# Patient Record
Sex: Female | Born: 1955 | Race: White | Hispanic: No | Marital: Married | Smoking: Former smoker
Health system: Southern US, Community
[De-identification: ages and names within clinical notes are randomized; demographics above are authoritative.]

## PROBLEM LIST (undated history)

## (undated) DIAGNOSIS — Z8719 Personal history of other diseases of the digestive system: Secondary | ICD-10-CM

## (undated) DIAGNOSIS — N63 Unspecified lump in unspecified breast: Secondary | ICD-10-CM

## (undated) DIAGNOSIS — Z803 Family history of malignant neoplasm of breast: Secondary | ICD-10-CM

## (undated) DIAGNOSIS — N952 Postmenopausal atrophic vaginitis: Secondary | ICD-10-CM

## (undated) DIAGNOSIS — B009 Herpesviral infection, unspecified: Secondary | ICD-10-CM

## (undated) DIAGNOSIS — Z9889 Other specified postprocedural states: Secondary | ICD-10-CM

## (undated) DIAGNOSIS — M8580 Other specified disorders of bone density and structure, unspecified site: Secondary | ICD-10-CM

## (undated) DIAGNOSIS — S52123A Displaced fracture of head of unspecified radius, initial encounter for closed fracture: Secondary | ICD-10-CM

## (undated) HISTORY — DX: Postmenopausal atrophic vaginitis: N95.2

## (undated) HISTORY — DX: Other specified postprocedural states: Z98.890

## (undated) HISTORY — DX: Unspecified lump in unspecified breast: N63.0

## (undated) HISTORY — DX: Family history of malignant neoplasm of breast: Z80.3

## (undated) HISTORY — DX: Herpesviral infection, unspecified: B00.9

## (undated) HISTORY — DX: Other specified disorders of bone density and structure, unspecified site: M85.80

## (undated) HISTORY — DX: Displaced fracture of head of unspecified radius, initial encounter for closed fracture: S52.123A

## (undated) HISTORY — PX: BREAST EXCISIONAL BIOPSY: SUR124

## (undated) HISTORY — DX: Personal history of other diseases of the digestive system: Z87.19

---

## 1997-04-05 ENCOUNTER — Other Ambulatory Visit: Admission: RE | Admit: 1997-04-05 | Discharge: 1997-04-05 | Payer: Self-pay | Admitting: Obstetrics and Gynecology

## 1997-07-09 ENCOUNTER — Other Ambulatory Visit: Admission: RE | Admit: 1997-07-09 | Discharge: 1997-07-09 | Payer: Self-pay | Admitting: Obstetrics and Gynecology

## 1997-12-25 ENCOUNTER — Other Ambulatory Visit: Admission: RE | Admit: 1997-12-25 | Discharge: 1997-12-25 | Payer: Self-pay | Admitting: Obstetrics and Gynecology

## 1998-04-03 ENCOUNTER — Other Ambulatory Visit: Admission: RE | Admit: 1998-04-03 | Discharge: 1998-04-03 | Payer: Self-pay | Admitting: Obstetrics and Gynecology

## 1999-05-05 ENCOUNTER — Other Ambulatory Visit: Admission: RE | Admit: 1999-05-05 | Discharge: 1999-05-05 | Payer: Self-pay | Admitting: Obstetrics and Gynecology

## 1999-05-06 ENCOUNTER — Encounter (INDEPENDENT_AMBULATORY_CARE_PROVIDER_SITE_OTHER): Payer: Self-pay | Admitting: Specialist

## 1999-05-06 ENCOUNTER — Other Ambulatory Visit: Admission: RE | Admit: 1999-05-06 | Discharge: 1999-05-06 | Payer: Self-pay | Admitting: Obstetrics and Gynecology

## 2000-07-08 ENCOUNTER — Other Ambulatory Visit: Admission: RE | Admit: 2000-07-08 | Discharge: 2000-07-08 | Payer: Self-pay | Admitting: Obstetrics and Gynecology

## 2000-10-16 ENCOUNTER — Emergency Department (HOSPITAL_COMMUNITY): Admission: EM | Admit: 2000-10-16 | Discharge: 2000-10-16 | Payer: Self-pay | Admitting: Emergency Medicine

## 2000-10-28 ENCOUNTER — Encounter: Admission: RE | Admit: 2000-10-28 | Discharge: 2000-10-28 | Payer: Self-pay | Admitting: Gastroenterology

## 2000-10-28 ENCOUNTER — Encounter: Payer: Self-pay | Admitting: Gastroenterology

## 2000-11-15 ENCOUNTER — Ambulatory Visit (HOSPITAL_COMMUNITY): Admission: RE | Admit: 2000-11-15 | Discharge: 2000-11-15 | Payer: Self-pay | Admitting: Gastroenterology

## 2001-07-12 ENCOUNTER — Other Ambulatory Visit: Admission: RE | Admit: 2001-07-12 | Discharge: 2001-07-12 | Payer: Self-pay | Admitting: Obstetrics and Gynecology

## 2001-07-21 ENCOUNTER — Encounter: Admission: RE | Admit: 2001-07-21 | Discharge: 2001-07-21 | Payer: Self-pay | Admitting: Obstetrics and Gynecology

## 2001-07-21 ENCOUNTER — Encounter: Payer: Self-pay | Admitting: Obstetrics and Gynecology

## 2001-11-08 ENCOUNTER — Encounter: Payer: Self-pay | Admitting: Obstetrics and Gynecology

## 2001-11-08 ENCOUNTER — Encounter: Admission: RE | Admit: 2001-11-08 | Discharge: 2001-11-08 | Payer: Self-pay | Admitting: Obstetrics and Gynecology

## 2002-02-21 ENCOUNTER — Encounter: Admission: RE | Admit: 2002-02-21 | Discharge: 2002-02-21 | Payer: Self-pay | Admitting: Obstetrics and Gynecology

## 2002-02-21 ENCOUNTER — Encounter: Payer: Self-pay | Admitting: Obstetrics and Gynecology

## 2002-07-13 ENCOUNTER — Other Ambulatory Visit: Admission: RE | Admit: 2002-07-13 | Discharge: 2002-07-13 | Payer: Self-pay | Admitting: Obstetrics and Gynecology

## 2003-08-23 ENCOUNTER — Other Ambulatory Visit: Admission: RE | Admit: 2003-08-23 | Discharge: 2003-08-23 | Payer: Self-pay | Admitting: Obstetrics and Gynecology

## 2003-11-09 ENCOUNTER — Encounter (INDEPENDENT_AMBULATORY_CARE_PROVIDER_SITE_OTHER): Payer: Self-pay | Admitting: *Deleted

## 2003-11-09 ENCOUNTER — Ambulatory Visit (HOSPITAL_COMMUNITY): Admission: RE | Admit: 2003-11-09 | Discharge: 2003-11-09 | Payer: Self-pay | Admitting: *Deleted

## 2003-11-09 ENCOUNTER — Ambulatory Visit (HOSPITAL_BASED_OUTPATIENT_CLINIC_OR_DEPARTMENT_OTHER): Admission: RE | Admit: 2003-11-09 | Discharge: 2003-11-09 | Payer: Self-pay | Admitting: *Deleted

## 2004-09-02 ENCOUNTER — Other Ambulatory Visit: Admission: RE | Admit: 2004-09-02 | Discharge: 2004-09-02 | Payer: Self-pay | Admitting: Obstetrics and Gynecology

## 2004-09-10 ENCOUNTER — Encounter: Admission: RE | Admit: 2004-09-10 | Discharge: 2004-09-10 | Payer: Self-pay | Admitting: Obstetrics and Gynecology

## 2004-09-23 ENCOUNTER — Encounter: Admission: RE | Admit: 2004-09-23 | Discharge: 2004-09-23 | Payer: Self-pay | Admitting: Obstetrics and Gynecology

## 2005-07-13 ENCOUNTER — Ambulatory Visit: Payer: Self-pay | Admitting: Infectious Diseases

## 2005-09-02 ENCOUNTER — Encounter: Admission: RE | Admit: 2005-09-02 | Discharge: 2005-09-02 | Payer: Self-pay | Admitting: Obstetrics and Gynecology

## 2005-09-09 ENCOUNTER — Other Ambulatory Visit: Admission: RE | Admit: 2005-09-09 | Discharge: 2005-09-09 | Payer: Self-pay | Admitting: Obstetrics and Gynecology

## 2006-09-08 ENCOUNTER — Encounter: Admission: RE | Admit: 2006-09-08 | Discharge: 2006-09-08 | Payer: Self-pay | Admitting: Obstetrics and Gynecology

## 2006-10-04 ENCOUNTER — Other Ambulatory Visit: Admission: RE | Admit: 2006-10-04 | Discharge: 2006-10-04 | Payer: Self-pay | Admitting: Obstetrics and Gynecology

## 2007-09-13 ENCOUNTER — Encounter: Admission: RE | Admit: 2007-09-13 | Discharge: 2007-09-13 | Payer: Self-pay | Admitting: Obstetrics and Gynecology

## 2007-10-05 ENCOUNTER — Other Ambulatory Visit: Admission: RE | Admit: 2007-10-05 | Discharge: 2007-10-05 | Payer: Self-pay | Admitting: Obstetrics and Gynecology

## 2007-11-23 ENCOUNTER — Encounter: Admission: RE | Admit: 2007-11-23 | Discharge: 2007-11-23 | Payer: Self-pay | Admitting: Neurosurgery

## 2007-12-07 ENCOUNTER — Ambulatory Visit (HOSPITAL_COMMUNITY): Admission: RE | Admit: 2007-12-07 | Discharge: 2007-12-07 | Payer: Self-pay | Admitting: Neurosurgery

## 2008-06-06 ENCOUNTER — Encounter: Payer: Self-pay | Admitting: Infectious Diseases

## 2008-06-13 ENCOUNTER — Encounter: Admission: RE | Admit: 2008-06-13 | Discharge: 2008-06-13 | Payer: Self-pay | Admitting: Neurosurgery

## 2008-06-15 ENCOUNTER — Encounter: Admission: RE | Admit: 2008-06-15 | Discharge: 2008-06-15 | Payer: Self-pay | Admitting: Neurosurgery

## 2008-10-01 ENCOUNTER — Encounter: Admission: RE | Admit: 2008-10-01 | Discharge: 2008-10-01 | Payer: Self-pay | Admitting: Obstetrics and Gynecology

## 2008-11-06 ENCOUNTER — Other Ambulatory Visit: Admission: RE | Admit: 2008-11-06 | Discharge: 2008-11-06 | Payer: Self-pay | Admitting: Obstetrics and Gynecology

## 2009-05-17 ENCOUNTER — Ambulatory Visit (HOSPITAL_COMMUNITY): Admission: RE | Admit: 2009-05-17 | Discharge: 2009-05-17 | Payer: Self-pay | Admitting: Obstetrics and Gynecology

## 2009-10-07 ENCOUNTER — Encounter: Admission: RE | Admit: 2009-10-07 | Discharge: 2009-10-07 | Payer: Self-pay | Admitting: Family Medicine

## 2009-11-13 ENCOUNTER — Other Ambulatory Visit: Admission: RE | Admit: 2009-11-13 | Discharge: 2009-11-13 | Payer: Self-pay | Admitting: Obstetrics and Gynecology

## 2010-01-31 ENCOUNTER — Other Ambulatory Visit: Payer: Self-pay | Admitting: Dermatology

## 2010-05-12 DIAGNOSIS — M8580 Other specified disorders of bone density and structure, unspecified site: Secondary | ICD-10-CM

## 2010-05-12 DIAGNOSIS — S52123A Displaced fracture of head of unspecified radius, initial encounter for closed fracture: Secondary | ICD-10-CM | POA: Insufficient documentation

## 2010-05-12 DIAGNOSIS — B009 Herpesviral infection, unspecified: Secondary | ICD-10-CM

## 2010-05-12 DIAGNOSIS — N952 Postmenopausal atrophic vaginitis: Secondary | ICD-10-CM

## 2010-05-12 DIAGNOSIS — K5732 Diverticulitis of large intestine without perforation or abscess without bleeding: Secondary | ICD-10-CM | POA: Insufficient documentation

## 2010-05-12 DIAGNOSIS — G4700 Insomnia, unspecified: Secondary | ICD-10-CM

## 2010-05-12 DIAGNOSIS — N901 Moderate vulvar dysplasia: Secondary | ICD-10-CM | POA: Insufficient documentation

## 2010-05-12 DIAGNOSIS — L299 Pruritus, unspecified: Secondary | ICD-10-CM

## 2010-05-12 DIAGNOSIS — M47816 Spondylosis without myelopathy or radiculopathy, lumbar region: Secondary | ICD-10-CM | POA: Insufficient documentation

## 2010-05-14 ENCOUNTER — Ambulatory Visit (INDEPENDENT_AMBULATORY_CARE_PROVIDER_SITE_OTHER): Payer: 59 | Admitting: Infectious Disease

## 2010-05-14 ENCOUNTER — Encounter: Payer: Self-pay | Admitting: Infectious Disease

## 2010-05-14 DIAGNOSIS — R509 Fever, unspecified: Secondary | ICD-10-CM

## 2010-05-14 DIAGNOSIS — A770 Spotted fever due to Rickettsia rickettsii: Secondary | ICD-10-CM

## 2010-05-14 DIAGNOSIS — M1990 Unspecified osteoarthritis, unspecified site: Secondary | ICD-10-CM | POA: Insufficient documentation

## 2010-05-14 DIAGNOSIS — B949 Sequelae of unspecified infectious and parasitic disease: Secondary | ICD-10-CM

## 2010-05-14 DIAGNOSIS — B948 Sequelae of other specified infectious and parasitic diseases: Secondary | ICD-10-CM

## 2010-05-14 DIAGNOSIS — M129 Arthropathy, unspecified: Secondary | ICD-10-CM

## 2010-05-14 DIAGNOSIS — A779 Spotted fever, unspecified: Secondary | ICD-10-CM

## 2010-05-14 NOTE — Assessment & Plan Note (Signed)
Lady's serologies indicate she has had right amounts but her fever in the past. Not clear to me that she had this in April and I was thought to have gotten much more gravely ill without antibiotics for 15 days. Will check right amounts but fever antibodies in her look antibodies to help the patient have clarity on what the cause of her recent acute illness was actually no role for further antibiotic therapy.

## 2010-05-14 NOTE — Assessment & Plan Note (Signed)
Given to this patient had a migrating rash and arthritic symptoms with fever in 2006 with apparently reportedly positive antibodies to Borrelia it is possible that she had brilliant. The antibodies that we have now however are negative IgG which is not consistent with her having Borrelia back in 2006. She does have positive IgM test for Borrelia I suspect this is a false-positive 3. Certainly her recent illness that she had in April does not sound at all like Lyme infection. Of note the Western blot is for Borrelia is fraught with parallel with only a requirement of 2 of 3 bands being positive. More than half the population has a positive P. 41 and body and many other individuals have a second positive antibody on this panel. In any case whether or not her chronic arthritis is due to a post Lyme arthritis or a zoster arthritis the management of the same namely nonsteroidals. There is actually no role for chronic antibiotics. I warned her of certain practitioners you like to make money off of patient's by giving them medications that are not indicated for a condition of "chronic Lyme infection". This is not a recognized clinical condition by infectious disease Society of [place]

## 2010-05-14 NOTE — Progress Notes (Signed)
Subjective:    Patient ID: Joy Arroyo, female    DOB: 13-Jan-1956, 55 y.o.   MRN: [id number]  HPI Joy Arroyo is a 55 year old Caucasian female who was referred to me by Dr. Sigmund Hazel, who had a prior illness in 2006 in which she had been bitten by a tick. At that time she developed an erythematous rash with this central clearing that could have been consistent with erythema migrans. She was treated within a day or 2 by her primary care physician at the time with a antibiotics for possible Borrelia burgdorferi, ultimately receiving a month of antibiotics. She apparently had antibodies that were positive for Lyme at that time although I do not have the serologies from that time in front of me. Any case she continued to have arthritic complaints that began after her tick bite and continued for months to years afterwards. She did not have other chronic symptoms other than fatigue which has been an issue for some time. She was recently again bitten by a tick parts 3 takes she then awoke on April 5 with vertiginous symptoms nausea chest tightness achiness with 2 bumps on her back she had some dizziness and nausea at work tells me she does me she had a fever although has not recorded in her journal that she presented to me she slept more than usual she felt exhausted. And had a sharp pain in her binder on and had a headache. Ultimately she saw her physician Dr. Hyacinth Meeker and had antibody testing done for [facility] spotted fever as well as for Lyme. Of note the patient was not treated with the doxycycline for [facility] spotted fever which showed a distended the conical suspicion was high that she would receive this antibiotic. In any case her [facility] spotted for fever IgG was positive on testing on the 20th and then she then received a seven-day course of doxycycline. GIM at that time was negative. Her antibody testing for Lyme came back with a negative Western blot for Borrelia by IgG with only the P4E1  antibody being positive and (5 of 8 bands AB positive for IgG to be positive.. Her IgM was positive with a IgM P4E1 antibody and IgM P. 39 antibody. She was referred to our clinic for further investigation of possible tick borne illnesses. I reviewed all of her laboratory data and her symptoms. I think it is possible that she had infection with Lyme in 2006 given that she had erythema migrans and arthritic condition. I would've expected however for her IgG to be positive on Western blot testing if this was truly an infection with Borrelia. I suppose it is possible that the antibiotics that she was given for the blood attorney in response but then this makes the question of why was the IgM positive. We frequently encounter false positive Lyme antibody tests especially Lyme IgM antibody tests. I suspect that this may that be the case with her that she may never have had Lyme he could have instead had sudden tick associated rash illness. In any case whether or not she had Lyme or not there is no indication for chronic antibiotic therapy for the chronic arthritis that sometimes happens after Lyme infection. I explained to her that there is no reason to give antibiotics for Borrelia and I was skeptical even of her original diagnosis. With regards to [facility] spotted fever the IgG that was positive shows that she densely had [facility] spotted fever at some point in  her life I wouldn't surprise of the IgG titer will be positive so quickly after the onset of her illness. It is possible. I would however expect the IgM also just been positive. In any case whether or not she had [facility] spotted fever she clearly recovered and she seems to have recovered without antibiotics because she was had the initial onset on the fifth of the month and was only treated more than 15 days later. End of whether or not she had [facility] spotted fever or not she clearly better there is no role for further antibiotic therapy. To help  clarify the specific episode O. repeat [facility] spotted fever IgG and IgM so that we can compare the titers. Also check her lithium antibodies. There is absolutely no role for further antibiotic therapy. I also explained that in the future should she have an acute illness at which [facility] spotted fever or [facility] being entertained that she should be given doxycycline immediately rather than waiting for tests to come back. With regards to her chronic arthritic complaints they could be due to osteoarthritis they could be due to possibly due to post Lyme arthritis although again on skeptical of this. In any case I counseled her to use nonsteroidals. I recommended checking some basic labs. It all out metastatic more than an hour with this patient including greater than 50% of time in counseling the patient according her care. the Review of Systems as in history present illness otherwise 12 point review of systems is completely negative.     Objective:   Physical Exam The patient is slightly underweight but healthy appearing she is alert and oriented x4. HEENT normocephalic and somatic pupils equal round react to light sclerae anicteric oropharynx clear without lesions or exudate. Neck supple cardiac exam regular rate and rhythm lungs clear to auscultation without wheezes or rhonchi. Abdomen soft nondistended nontender. Extremities without edema. Muscle skeletal exam she is wearing a carpal tunnel brace on her right wrist. She has some tenderness in her PIP and DIP joints but not much significant synovitis present. She has no elbow effusions her knees are without effusions and without tenderness to varus or valgus stress. Her neurological exam is nonfocal with normal gait normal strength and normal sensation grossly. Skin exam is negative for petechiae bruises rashes or other lesions. Gastric exam patient is slightly anxious but otherwise appropriate and pleasant and engaged with normal linear thought  processes normal affect otherwise.       Assessment & Plan:  [facility] spotted fever) Lady's serologies indicate she has had right amounts but her fever in the past. Not clear to me that she had this in April and I was thought to have gotten much more gravely ill without antibiotics for 15 days. Will check right amounts but fever antibodies in her look antibodies to help the patient have clarity on what the cause of her recent acute illness was actually no role for further antibiotic therapy.  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 The antibodies that we have now however are negative IgG which is not consistent with her having Borrelia back in 2006. She does have positive IgM test for Borrelia I suspect this is a false-positive 3. Certainly her recent illness that she had in April does not sound at all like Lyme infection. Of  note the Western blot is for Borrelia is fraught with parallel with only a requirement of 2 of 3 bands being positive. More than half the population has a positive P. 41 and body and many other individuals have a second positive antibody on this panel. In any case whether or not her chronic arthritis is due to a post Lyme arthritis or a zoster arthritis the management of the same namely nonsteroidals. There is actually no role for chronic antibiotics. I warned her of certain practitioners you like to make money off of patient's by giving them medications that are not indicated for a condition of "chronic Lyme infection". This is not a recognized clinical condition by infectious disease Society of [place]   Arthritis I suspect she has osteoarthritis but I will check an ANA sedimentation rate and C-reactive protein and rheumatoid factor. If she needs to she could see a rheumatologist for further workup  she had plain films of her hands done by hand surgeon years ago.

## 2010-05-14 NOTE — Assessment & Plan Note (Signed)
I suspect she has osteoarthritis but I will check an ANA sedimentation rate and C-reactive protein and rheumatoid factor. If she needs to she could see a rheumatologist for further workup she had plain films of her hands done by hand surgeon years ago.

## 2010-05-29 ENCOUNTER — Telehealth: Payer: Self-pay | Admitting: *Deleted

## 2010-05-29 NOTE — Telephone Encounter (Signed)
Pt left message wanting to talk with Dr. Daiva Eves about any further treatment or lab work.  She stated that she knew her lab results.  RN returned her call.  Pt is not experiencing fever, H/A, or joint pain.   Her fatigue is improving.  She only wanted to know whether Dr. Daiva Eves would recommend repeating the RSMF test as was mentioned in the lab report.  Pt was advised that Dr. Daiva Eves is currently on vacation and would not be able to respond quickly.  Pt verbalized understanding.  Jennet Maduro, RN

## 2010-05-30 ENCOUNTER — Encounter: Payer: Self-pay | Admitting: Infectious Disease

## 2010-05-30 NOTE — Op Note (Signed)
Joy Arroyo, Joy Arroyo                ACCOUNT NO.:  [id number]   MEDICAL RECORD NO.:  [id number]          PATIENT TYPE:  AMB   LOCATION:  [facility]                         FACILITY:  [facility]   PHYSICIAN:  Vikki Ports, MDDATE OF BIRTH:  06-30-1955   DATE OF PROCEDURE:  11/09/2003  DATE OF DISCHARGE:                                 OPERATIVE REPORT   PREOPERATIVE DIAGNOSIS:  Left inguinal hernia and 6 cm lipoma of the back.   POSTOPERATIVE DIAGNOSIS:  Left inguinal hernia and 6 cm lipoma of the back.   PROCEDURE:  Laparoscopic left inguinal hernia repair with mesh and excision  of lipoma of the back.   ANESTHESIA:  General.   SURGEON:  Vikki Ports, MD   DESCRIPTION OF PROCEDURE:  Patient was taken to the operating room and  placed in a supine position.  After adequate general anesthesia was induced,  the patient was placed in the right lateral decubitus position.  The back  was prepped and draped in a normal sterile fashion, and a small transverse  incision was made over the palpable mass.  A well-encapsulated lipoma was  excised from deep to the muscle and sent for pathologic evaluation.  Adequate hemostasis was insured, and the skin was closed with subcuticular 4-  0 Monocryl.   Patient was then placed in the supine position, and the abdomen was prepped  and draped in a normal sterile fashion after a Foley catheter had been  placed.  Using a transverse infraumbilical incision, I dissected down onto  the left anterior rectus fascia, which was opened.  Also, fibers were  retracted laterally, and pre-peritoneal dissecting balloon was placed, and  under direct visualization was dissected with pumps of air.  Then the  operating balloon was placed, and under direct visualization, two 5 mm  trocars were placed in the right abdomen.  A pseudohernia sac was delivered  posteriorly, and a piece of Parietex mesh was placed over the floor of  Hesselbach's triangle and tacked with  a spiral tacker.  I was satisfied with  the coverage of the mesh.  Pneumo pre-peritoneum was released.  Trocars were  removed.  The fascial defect was closed with a figure-of-eight 0 Vicryl  suture.  The skin was closed with subcuticular 4-0 Monocryl.  Steri-Strips  and sterile dressings were applied.  Patient tolerated the procedure well  and went to the PACU in good condition.     Gaylyn Rong   KRH/MEDQ  D:  11/16/2003  T:  11/16/2003  Job:  [id number]

## 2010-10-08 ENCOUNTER — Other Ambulatory Visit: Payer: Self-pay | Admitting: Obstetrics and Gynecology

## 2010-10-08 DIAGNOSIS — Z1231 Encounter for screening mammogram for malignant neoplasm of breast: Secondary | ICD-10-CM

## 2010-10-16 ENCOUNTER — Other Ambulatory Visit: Payer: Self-pay | Admitting: Dermatology

## 2010-10-21 ENCOUNTER — Ambulatory Visit
Admission: RE | Admit: 2010-10-21 | Discharge: 2010-10-21 | Disposition: A | Discharge status: Home / Self Care | Payer: 59 | Source: Ambulatory Visit | Attending: Obstetrics and Gynecology | Admitting: Obstetrics and Gynecology

## 2010-10-21 DIAGNOSIS — Z1231 Encounter for screening mammogram for malignant neoplasm of breast: Secondary | ICD-10-CM

## 2011-07-27 IMAGING — MG MM SCREEN MAMMOGRAM BILATERAL
3 series · 3 of 3 positions shown · non-contrast
Comparison: none

DG SCREEN MAMMOGRAM BILATERAL
Bilateral CC and MLO view(s) were taken.

DIGITAL SCREENING MAMMOGRAM WITH CAD:
The breast tissue is extremely dense.  No masses or malignant type calcifications are identified.  
Compared with prior studies.
Images were processed with CAD.

[R CC]
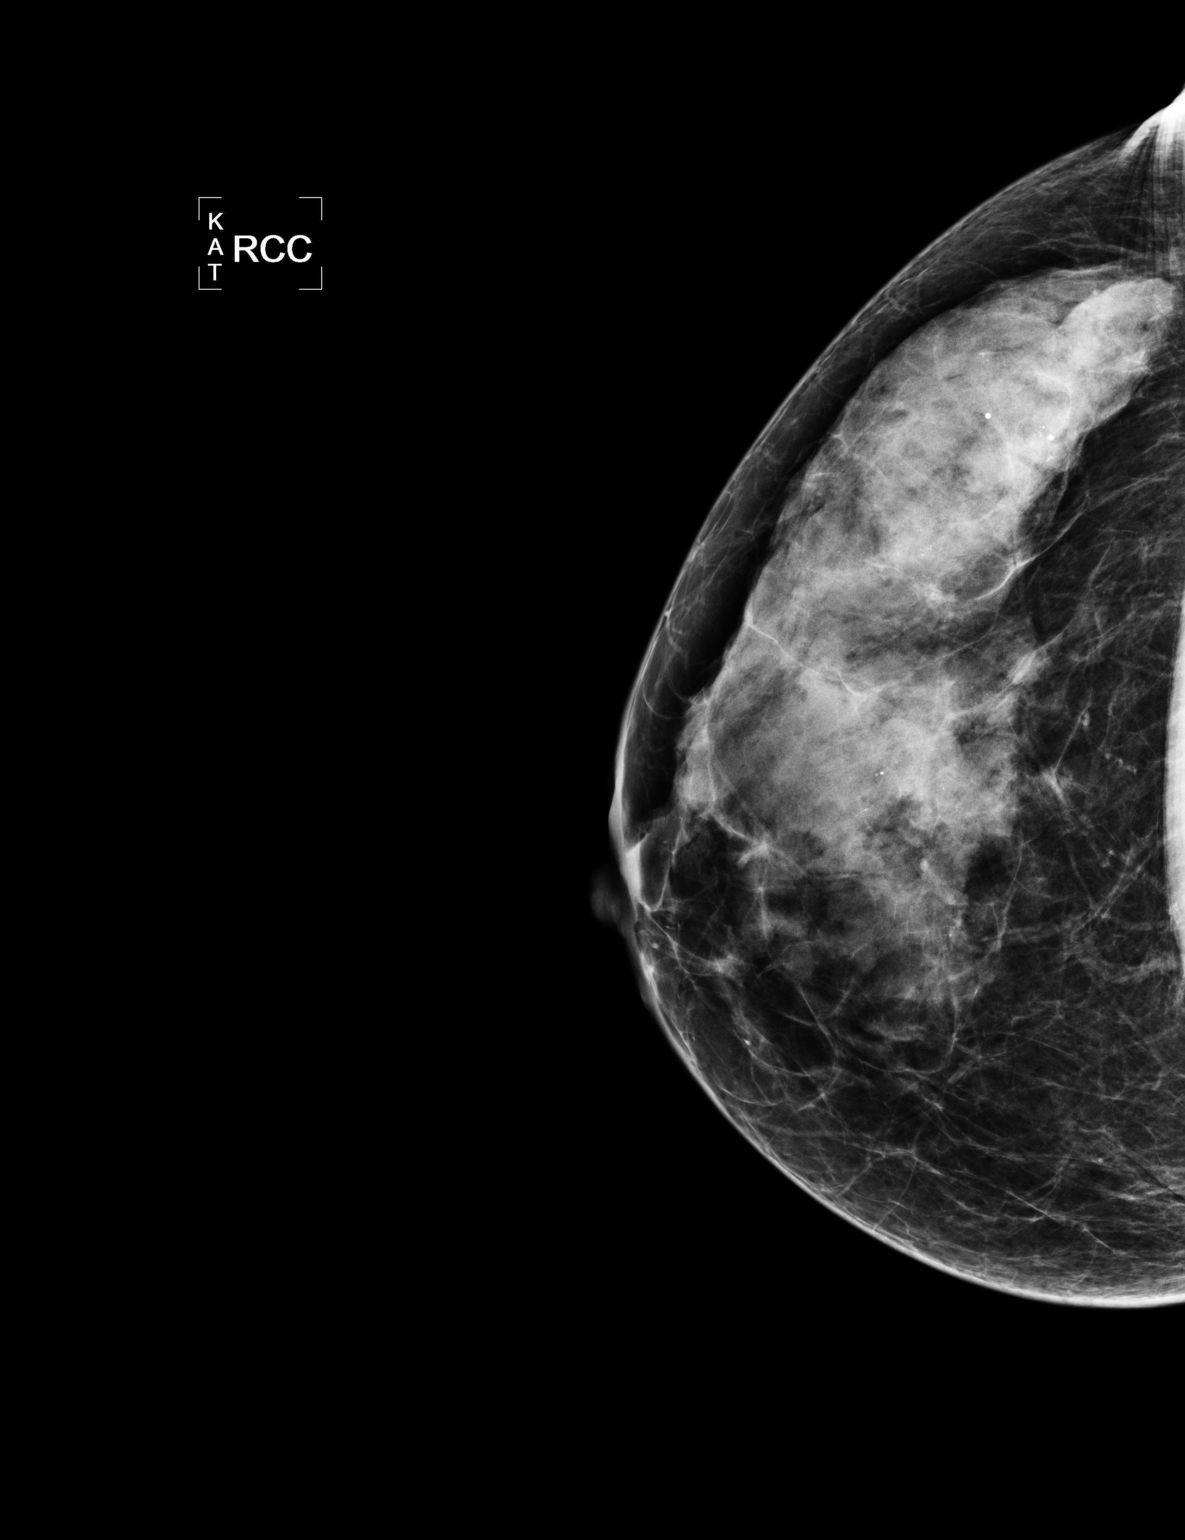

[L CC]
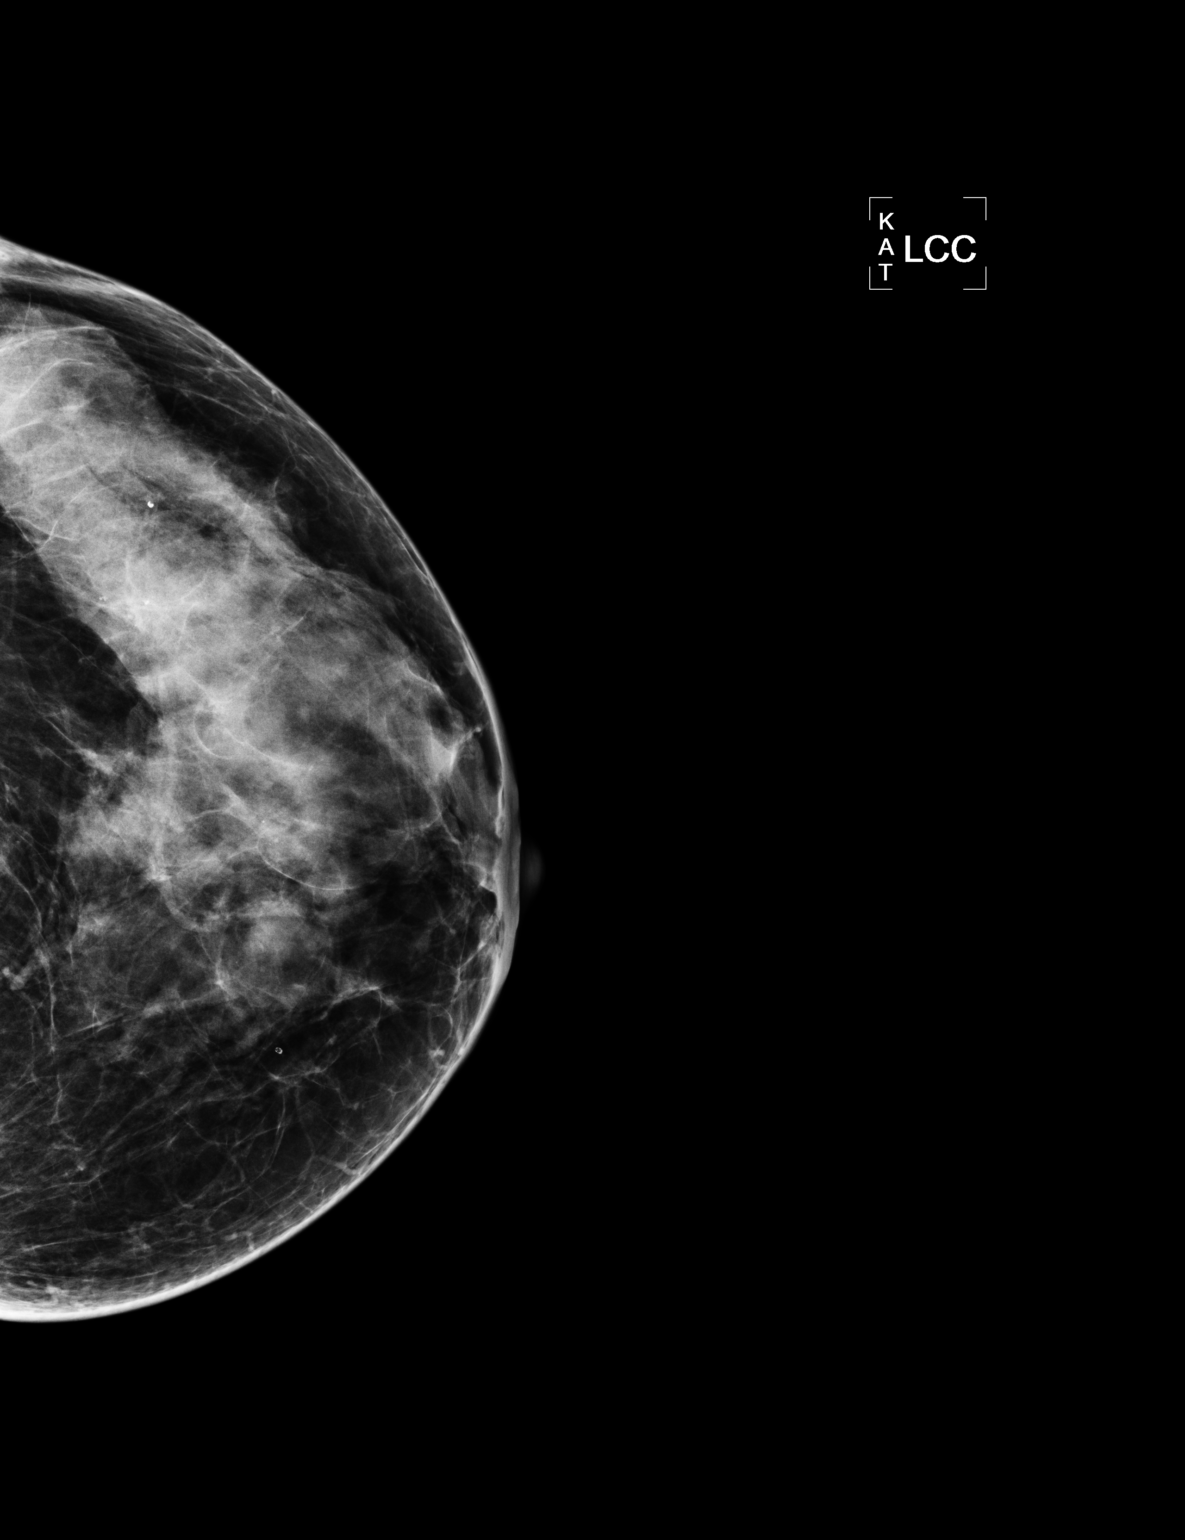

[R MLO]
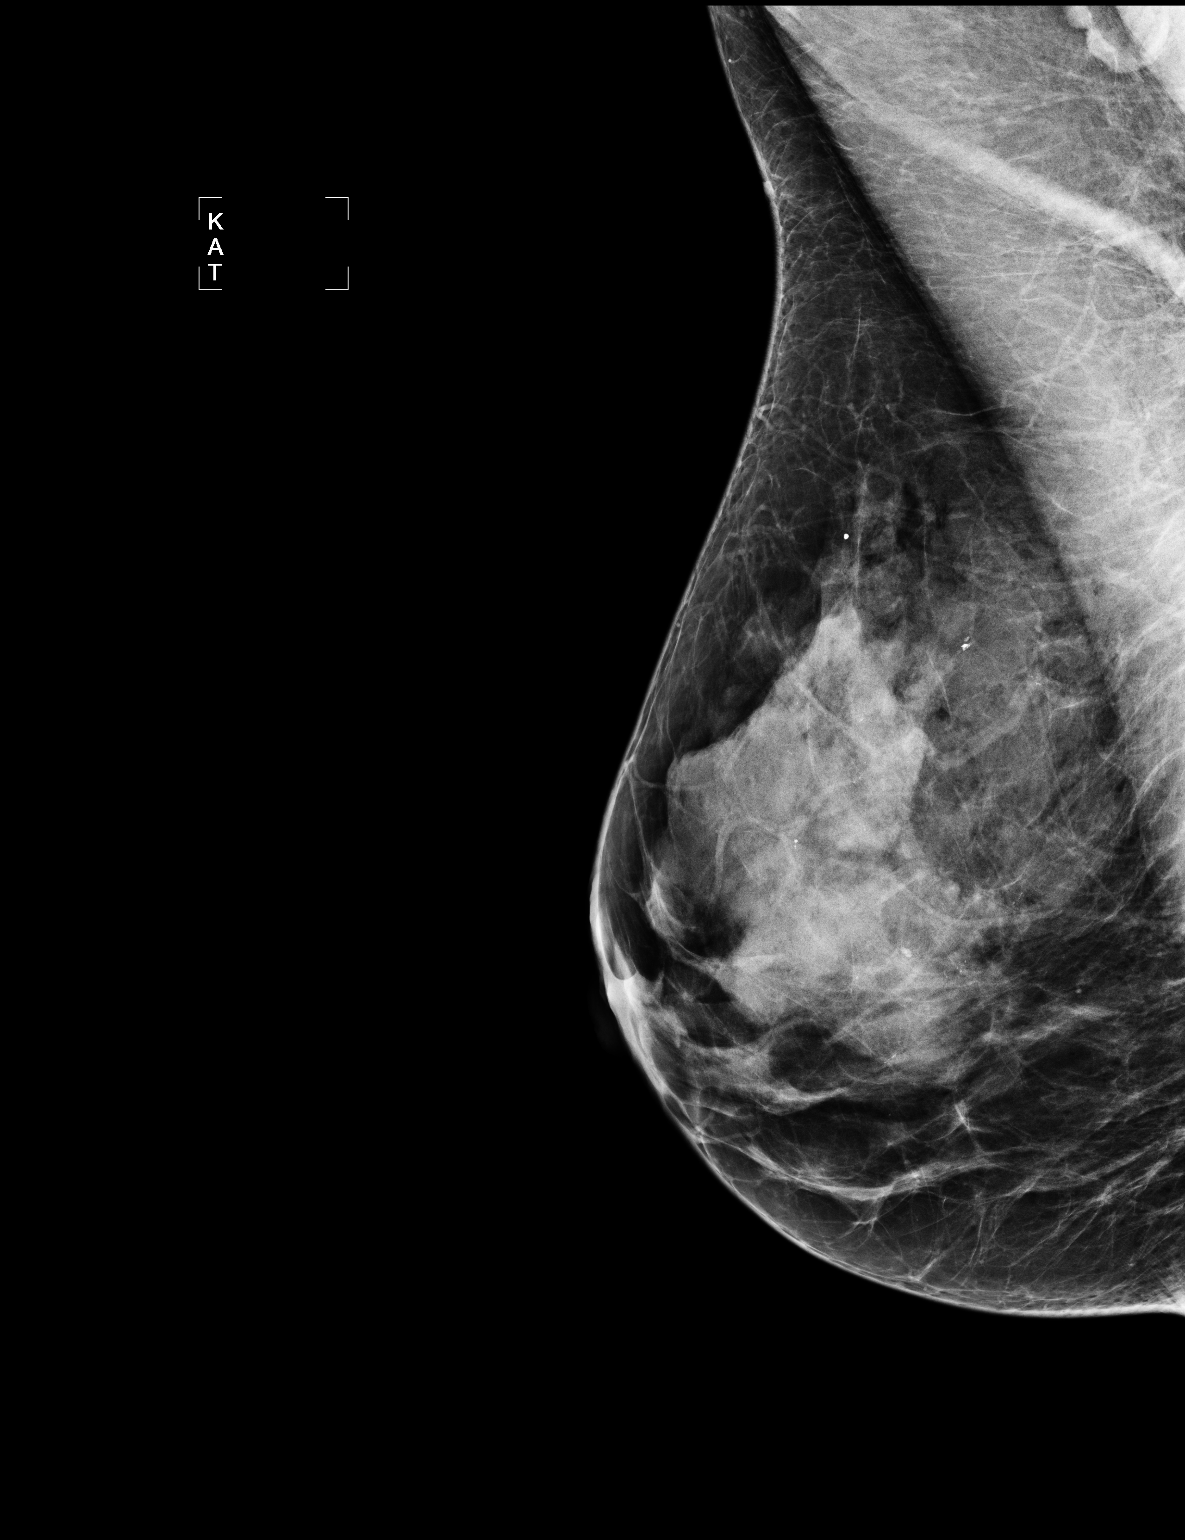

[3 of 3 positions shown; findings below may reference images not displayed]

IMPRESSION: No specific mammographic evidence of malignancy.  Next screening mammogram is recommended in one 
year.

A result letter of this screening mammogram will be mailed directly to the patient.

ASSESSMENT: Negative - BI-RADS 1

Screening mammogram in 1 year.
,

## 2011-10-07 ENCOUNTER — Other Ambulatory Visit: Payer: Self-pay | Admitting: Obstetrics and Gynecology

## 2011-10-07 DIAGNOSIS — Z1231 Encounter for screening mammogram for malignant neoplasm of breast: Secondary | ICD-10-CM

## 2011-10-29 ENCOUNTER — Ambulatory Visit
Admission: RE | Admit: 2011-10-29 | Discharge: 2011-10-29 | Disposition: A | Discharge status: Home / Self Care | Payer: 59 | Source: Ambulatory Visit | Attending: Obstetrics and Gynecology | Admitting: Obstetrics and Gynecology

## 2011-10-29 DIAGNOSIS — Z1231 Encounter for screening mammogram for malignant neoplasm of breast: Secondary | ICD-10-CM

## 2011-11-04 ENCOUNTER — Other Ambulatory Visit: Payer: Self-pay | Admitting: Dermatology

## 2012-10-10 ENCOUNTER — Other Ambulatory Visit: Payer: Self-pay | Admitting: Obstetrics and Gynecology

## 2012-10-10 DIAGNOSIS — Z1231 Encounter for screening mammogram for malignant neoplasm of breast: Secondary | ICD-10-CM

## 2012-10-19 ENCOUNTER — Other Ambulatory Visit: Payer: Self-pay

## 2012-10-19 DIAGNOSIS — Z1231 Encounter for screening mammogram for malignant neoplasm of breast: Secondary | ICD-10-CM

## 2012-11-08 ENCOUNTER — Ambulatory Visit: Payer: 59

## 2012-11-17 ENCOUNTER — Ambulatory Visit
Admission: RE | Admit: 2012-11-17 | Discharge: 2012-11-17 | Disposition: A | Discharge status: Home / Self Care | Payer: 59 | Source: Ambulatory Visit

## 2012-11-17 DIAGNOSIS — Z1231 Encounter for screening mammogram for malignant neoplasm of breast: Secondary | ICD-10-CM

## 2012-11-28 ENCOUNTER — Other Ambulatory Visit: Payer: Self-pay | Admitting: Nurse Practitioner

## 2012-11-28 ENCOUNTER — Other Ambulatory Visit (HOSPITAL_COMMUNITY)
Admission: RE | Admit: 2012-11-28 | Discharge: 2012-11-28 | Disposition: A | Discharge status: Home / Self Care | Payer: 59 | Source: Ambulatory Visit | Attending: Nurse Practitioner | Admitting: Nurse Practitioner

## 2012-11-28 DIAGNOSIS — Z01419 Encounter for gynecological examination (general) (routine) without abnormal findings: Secondary | ICD-10-CM | POA: Insufficient documentation

## 2012-11-28 DIAGNOSIS — Z1151 Encounter for screening for human papillomavirus (HPV): Secondary | ICD-10-CM | POA: Insufficient documentation

## 2013-02-20 ENCOUNTER — Other Ambulatory Visit: Payer: Self-pay | Admitting: Obstetrics and Gynecology

## 2013-02-20 DIAGNOSIS — Z842 Family history of other diseases of the genitourinary system: Secondary | ICD-10-CM

## 2013-02-24 ENCOUNTER — Other Ambulatory Visit: Payer: 59

## 2013-03-21 ENCOUNTER — Ambulatory Visit
Admission: RE | Admit: 2013-03-21 | Discharge: 2013-03-21 | Disposition: A | Discharge status: Home / Self Care | Payer: 59 | Source: Ambulatory Visit | Attending: Obstetrics and Gynecology | Admitting: Obstetrics and Gynecology

## 2013-03-21 DIAGNOSIS — Z842 Family history of other diseases of the genitourinary system: Secondary | ICD-10-CM

## 2013-03-21 MED ORDER — GADOBENATE DIMEGLUMINE 529 MG/ML IV SOLN
10.0000 mL | Freq: Once | INTRAVENOUS | Status: AC | PRN
  Administered 2013-03-21: 10 mL via INTRAVENOUS

## 2013-10-13 ENCOUNTER — Other Ambulatory Visit: Payer: Self-pay

## 2013-10-13 DIAGNOSIS — Z1231 Encounter for screening mammogram for malignant neoplasm of breast: Secondary | ICD-10-CM

## 2013-11-20 ENCOUNTER — Ambulatory Visit: Payer: 59

## 2013-11-22 ENCOUNTER — Ambulatory Visit
Admission: RE | Admit: 2013-11-22 | Discharge: 2013-11-22 | Disposition: A | Discharge status: Home / Self Care | Payer: 59 | Source: Ambulatory Visit

## 2013-11-22 DIAGNOSIS — Z1231 Encounter for screening mammogram for malignant neoplasm of breast: Secondary | ICD-10-CM

## 2014-10-19 ENCOUNTER — Other Ambulatory Visit: Payer: Self-pay

## 2014-10-19 DIAGNOSIS — Z1231 Encounter for screening mammogram for malignant neoplasm of breast: Secondary | ICD-10-CM

## 2014-11-15 ENCOUNTER — Ambulatory Visit
Admission: RE | Admit: 2014-11-15 | Discharge: 2014-11-15 | Disposition: A | Discharge status: Home / Self Care | Payer: 59 | Source: Ambulatory Visit

## 2014-11-15 DIAGNOSIS — Z1231 Encounter for screening mammogram for malignant neoplasm of breast: Secondary | ICD-10-CM

## 2015-04-15 DIAGNOSIS — Z0000 Encounter for general adult medical examination without abnormal findings: Secondary | ICD-10-CM | POA: Diagnosis not present

## 2015-06-03 DIAGNOSIS — J0100 Acute maxillary sinusitis, unspecified: Secondary | ICD-10-CM | POA: Diagnosis not present

## 2015-06-03 DIAGNOSIS — S30861A Insect bite (nonvenomous) of abdominal wall, initial encounter: Secondary | ICD-10-CM | POA: Diagnosis not present

## 2015-06-06 DIAGNOSIS — G4700 Insomnia, unspecified: Secondary | ICD-10-CM | POA: Diagnosis not present

## 2015-06-06 DIAGNOSIS — N6019 Diffuse cystic mastopathy of unspecified breast: Secondary | ICD-10-CM | POA: Diagnosis not present

## 2015-09-25 DIAGNOSIS — D2272 Melanocytic nevi of left lower limb, including hip: Secondary | ICD-10-CM | POA: Diagnosis not present

## 2015-09-25 DIAGNOSIS — L570 Actinic keratosis: Secondary | ICD-10-CM | POA: Diagnosis not present

## 2015-09-25 DIAGNOSIS — Z85828 Personal history of other malignant neoplasm of skin: Secondary | ICD-10-CM | POA: Diagnosis not present

## 2015-09-25 DIAGNOSIS — L821 Other seborrheic keratosis: Secondary | ICD-10-CM | POA: Diagnosis not present

## 2015-09-25 DIAGNOSIS — D1801 Hemangioma of skin and subcutaneous tissue: Secondary | ICD-10-CM | POA: Diagnosis not present

## 2015-10-15 ENCOUNTER — Other Ambulatory Visit: Payer: Self-pay | Admitting: Nurse Practitioner

## 2015-10-15 DIAGNOSIS — Z1231 Encounter for screening mammogram for malignant neoplasm of breast: Secondary | ICD-10-CM

## 2015-11-18 ENCOUNTER — Ambulatory Visit
Admission: RE | Admit: 2015-11-18 | Discharge: 2015-11-18 | Disposition: A | Discharge status: Home / Self Care | Payer: BLUE CROSS/BLUE SHIELD | Source: Ambulatory Visit | Attending: Nurse Practitioner | Admitting: Nurse Practitioner

## 2015-11-18 DIAGNOSIS — Z1231 Encounter for screening mammogram for malignant neoplasm of breast: Secondary | ICD-10-CM | POA: Diagnosis not present

## 2015-12-10 ENCOUNTER — Other Ambulatory Visit: Payer: Self-pay | Admitting: Nurse Practitioner

## 2015-12-10 ENCOUNTER — Other Ambulatory Visit (HOSPITAL_COMMUNITY)
Admission: RE | Admit: 2015-12-10 | Discharge: 2015-12-10 | Disposition: A | Discharge status: Home / Self Care | Payer: BLUE CROSS/BLUE SHIELD | Source: Ambulatory Visit | Attending: Nurse Practitioner | Admitting: Nurse Practitioner

## 2015-12-10 DIAGNOSIS — Z01419 Encounter for gynecological examination (general) (routine) without abnormal findings: Secondary | ICD-10-CM | POA: Insufficient documentation

## 2015-12-10 DIAGNOSIS — Z1151 Encounter for screening for human papillomavirus (HPV): Secondary | ICD-10-CM | POA: Diagnosis not present

## 2015-12-11 ENCOUNTER — Other Ambulatory Visit: Payer: Self-pay | Admitting: Nurse Practitioner

## 2015-12-11 DIAGNOSIS — Z803 Family history of malignant neoplasm of breast: Secondary | ICD-10-CM

## 2015-12-12 LAB — CYTOLOGY - PAP
Diagnosis: NEGATIVE
HPV: NOT DETECTED

## 2016-01-20 DIAGNOSIS — J988 Other specified respiratory disorders: Secondary | ICD-10-CM | POA: Diagnosis not present

## 2016-03-23 ENCOUNTER — Inpatient Hospital Stay
Admission: RE | Admit: 2016-03-23 | Discharge: 2016-03-23 | Disposition: A | Discharge status: Home / Self Care | Payer: BLUE CROSS/BLUE SHIELD | Source: Ambulatory Visit | Attending: Nurse Practitioner | Admitting: Nurse Practitioner

## 2016-04-09 DIAGNOSIS — H5310 Unspecified subjective visual disturbances: Secondary | ICD-10-CM | POA: Diagnosis not present

## 2016-04-09 DIAGNOSIS — D3132 Benign neoplasm of left choroid: Secondary | ICD-10-CM | POA: Diagnosis not present

## 2016-04-13 DIAGNOSIS — Z0000 Encounter for general adult medical examination without abnormal findings: Secondary | ICD-10-CM | POA: Diagnosis not present

## 2016-04-13 DIAGNOSIS — E559 Vitamin D deficiency, unspecified: Secondary | ICD-10-CM | POA: Diagnosis not present

## 2016-04-16 DIAGNOSIS — E559 Vitamin D deficiency, unspecified: Secondary | ICD-10-CM | POA: Diagnosis not present

## 2016-04-16 DIAGNOSIS — Z0000 Encounter for general adult medical examination without abnormal findings: Secondary | ICD-10-CM | POA: Diagnosis not present

## 2016-06-09 DIAGNOSIS — Z79890 Hormone replacement therapy (postmenopausal): Secondary | ICD-10-CM | POA: Diagnosis not present

## 2016-06-09 DIAGNOSIS — F411 Generalized anxiety disorder: Secondary | ICD-10-CM | POA: Diagnosis not present

## 2016-08-04 ENCOUNTER — Telehealth: Payer: Self-pay | Admitting: Genetics

## 2016-08-04 ENCOUNTER — Encounter: Payer: Self-pay | Admitting: Genetics

## 2016-08-04 NOTE — Telephone Encounter (Signed)
Genetic counseling appt has been scheduled for the pt to see Tobi Bastosnna on 8/14 at 1pm. Pt aware to arrive 15 minutes early. Address and insurance verified. Letter mailed.

## 2016-08-25 ENCOUNTER — Other Ambulatory Visit: Payer: BLUE CROSS/BLUE SHIELD

## 2016-08-25 ENCOUNTER — Encounter: Payer: BLUE CROSS/BLUE SHIELD | Admitting: Genetics

## 2016-10-12 ENCOUNTER — Other Ambulatory Visit: Payer: Self-pay | Admitting: Nurse Practitioner

## 2016-10-12 DIAGNOSIS — Z1231 Encounter for screening mammogram for malignant neoplasm of breast: Secondary | ICD-10-CM

## 2016-10-21 ENCOUNTER — Encounter: Payer: Self-pay | Admitting: Genetic Counselor

## 2016-10-21 ENCOUNTER — Other Ambulatory Visit: Payer: BLUE CROSS/BLUE SHIELD

## 2016-10-21 ENCOUNTER — Ambulatory Visit (HOSPITAL_BASED_OUTPATIENT_CLINIC_OR_DEPARTMENT_OTHER): Payer: BLUE CROSS/BLUE SHIELD | Admitting: Genetic Counselor

## 2016-10-21 DIAGNOSIS — Z803 Family history of malignant neoplasm of breast: Secondary | ICD-10-CM | POA: Diagnosis not present

## 2016-10-21 NOTE — Progress Notes (Signed)
REFERRING PROVIDER: Arlyce Harman, NP [address] [place], [place] [address]  PRIMARY PROVIDER:  Kathyrn Lass, MD  PRIMARY REASON FOR VISIT:  1. Family history of breast cancer      HISTORY OF PRESENT ILLNESS:   Joy Arroyo, a 61 y.o. female, was seen for a [place] cancer genetics consultation at the request of Dr. Bethel Born due to a family history of cancer.  Joy Arroyo presents to clinic today to discuss the possibility of a hereditary predisposition to cancer, genetic testing, and to further clarify her future cancer risks, as well as potential cancer risks for family members. Joy Arroyo is a 61 y.o. female with no personal history of cancer.  She is not too interested in pursuing genetic testing, however, would want to know how high her risk for breast cancer is and whether she is at a risk high enough to warrant an MRI.  CANCER HISTORY:   No history exists.     HORMONAL RISK FACTORS:  Menarche was at age 61-13.  First live birth at age N/A .  OCP use for approximately 25+ years.  Ovaries intact: yes.  Hysterectomy: no.  Menopausal status: postmenopausal.  HRT use: used for 0 years, but is currently on estrace. Colonoscopy: yes; normal. Mammogram within the last year: yes. Number of breast biopsies: one biopsy at age 20 that was a fibroadenoma. Up to date with pelvic exams:  yes. Any excessive radiation exposure in the past:  no  Past Medical History:  Diagnosis Date  . Atrophic vaginitis   . Breast lump   . Diverticula of colon   . Family history of breast cancer   . HSV (herpes simplex virus) infection   . Osteopenia   . Radial head fracture   . S/P hernia repair     No past surgical history on file.  Social History   Social History  . Marital status: Married    Spouse name: N/A  . Number of children: N/A  . Years of education: N/A   Social History Main Topics  . Smoking status: Former Smoker    Quit date: 05/14/1978  . Smokeless tobacco: Not  on file  . Alcohol use Yes  . Drug use: No  . Sexual activity: Not on file   Other Topics Concern  . Not on file   Social History Narrative  . No narrative on file     FAMILY HISTORY:  We obtained a detailed, 4-generation family history.  Significant diagnoses are listed below: Family History  Problem Relation Age of Onset  . Alcohol abuse Mother   . Heart attack Mother   . Stroke Father   . Lung cancer Father   . Breast cancer Sister 75       metastatic at 61  . Alcohol abuse Brother   . Cirrhosis Brother   . Dementia Maternal Uncle   . Lung cancer Paternal Uncle   . Heart disease Maternal Grandmother   . Skin cancer Maternal Grandfather   . Alcohol abuse Paternal Grandfather   . Breast cancer Sister 50       lobular  . Dementia Paternal Uncle   . Esophageal cancer Other        nephew    The patient does not have children.  She has three sisters and a brothers.  The brother died due to complications of alcoholism.  His oldest child was recently dx with esophageal cancer.  Two of her sisters were diagnosed  with breast cancer.  One was diagnosed with ductal ER+ breast cancer at age 53 and the other was diagnosed with ER+ lobular breast cancer at 71. Reportedly neither have had genetic testing. Both parents are deceased.  The patient's mother died of a heart attack at age 57.  She had one brother who died from Alzheimer's disease complications.  The maternal grandfather had skin cancer and the grandmother died of heart disease.  The patient's father died of lung cancer at age 42.  He had four sisters and three brothers.  One brother and two sisters are living in their 76's-90's. One brother died of lung cancer, and had a daughter who developed breast cancer.  Another brother died from complications from Alzheimer's disease.  One sister had cervical cancer and died of Hep C, and the other died of non cancer related issues.  The grandparents are both deceased.  The grandfather  died of alcoholism complications at 34 and the grandmother died at 54.  Joy Arroyo is unaware of previous family history of genetic testing for hereditary cancer risks. Patient's maternal ancestors are of unknown descent, and paternal ancestors are of unknown descent. There is no reported Ashkenazi Jewish ancestry. There is no known consanguinity.  GENETIC COUNSELING ASSESSMENT: Joy BIELICKI is a 61 y.o. female with a family history of breast cancer which is somewhat suggestive of a hereditary breast cancer syndrome and predisposition to cancer. We, therefore, discussed and recommended the following at today's visit.   DISCUSSION: We discussed that about 5-10% of breast cancer is hereditary with most cases due to BRCA mutations.  Other genes that are associated with hereditary breast cancer syndromes include ATM, CHEK2 and PALB2.  We reviewed the characteristics, features and inheritance patterns of hereditary cancer syndromes. We also discussed genetic testing, including the appropriate family members to test, the process of testing, insurance coverage and turn-around-time for results. Based on her family history, her sister Britt Boozer would be the best person to undergo genetic testing.  This is because she was the youngest person in the family who was diagnosed with breast cancer and she meets medical criteria for testing on her own.  However, her other sister, Verdis Frederickson, could also undergo genetic testing.    Joy Arroyo explained that she was under the impression her sister Britt Boozer could not get tested because her diagnosis was too long ago and that she has Medicare.  I explained that Medicare covers genetic testing and that it did not matter how long ago the diagnosis was, only whether her diagnosis met medical criteria for testing.  Joy Arroyo is not interested in pursuing genetic testing on herself at this time.  She understands that if her sisters test positive for a hereditary syndrome, that she could be at 50%  risk for having this same mutation.  We discussed the implications of a negative, positive and/or variant of uncertain significant result.   Based on Joy Arroyo family history of cancer, she meets medical criteria for genetic testing. Despite that she meets criteria, she may still have an out of pocket cost. We discussed that if her out of pocket cost for testing is over $100, the laboratory will call and confirm whether she wants to proceed with testing.  If the out of pocket cost of testing is less than $100 she will be billed by the genetic testing laboratory.   In order to estimate her chance of having a BRCA mutation, we used statistical models (Tyrer Cusik) and laboratory data that  take into account her personal medical history, family history and ancestry.  Because each model is different, there can be a lot of variability in the risks they give.  Therefore, these numbers must be considered a rough range and not a precise risk of having a BRCA mutation.  These models estimate that she has approximately a 0.35% chance of having a mutation.   Based on the patient's personal and family history, statistical models (Tyrer Cusik and [place])  and literature data were used to estimate her risk of developing breast cancer. These estimate her lifetime risk of developing breast cancer to be approximately 22.6% to 24.1%. This estimation does not take into account any genetic testing results.  The patient's lifetime breast cancer risk is a preliminary estimate based on available information using one of several models endorsed by the [place] (ACS). The ACS recommends consideration of breast MRI screening as an adjunct to mammography for patients at high risk (defined as 20% or greater lifetime risk). A more detailed breast cancer risk assessment can be considered, if clinically indicated.   Joy Arroyo has been determined to be at high risk for breast cancer.  Therefore, we recommend that annual screening  with mammography and breast MRI.  We also discussed that the ACS recommendation does not guarantee medical coverage of breast MRI, however these risk assessments could be used in a letter of medical necessity.  We discussed that Joy Arroyo should discuss her individual situation with her referring physician and determine a breast cancer screening plan with which they are both comfortable.       PLAN: Despite our recommendation, Joy Arroyo did not wish to pursue genetic testing at today's visit. We understand this decision, and remain available to coordinate genetic testing at any time in the future. We; therefore, recommend Joy Arroyo continue to follow the cancer screening guidelines given by her primary healthcare provider.  Based on Joy Arroyo family history, we recommended her sisters, Delores and/or Verdis Frederickson, who were diagnosed with breast cancer at age 55 and 43, respectively, have genetic counseling and testing. Joy Arroyo will let us know if we can be of any assistance in coordinating genetic counseling and/or testing for this family member.   Lastly, we encouraged Joy Arroyo to remain in contact with cancer genetics annually so that we can continuously update the family history and inform her of any changes in cancer genetics and testing that may be of benefit for this family.   Ms.  Arroyo questions were answered to her satisfaction today. Our contact information was provided should additional questions or concerns arise. Thank you for the referral and allowing [place] to share in the care of your patient.   Karen P. Florene Glen, [place], [facility] Certified Genetic Counselor [email or web address] phone: [phone]  The patient was seen for a total of 60 minutes in face-to-face genetic counseling.  This patient was discussed with Drs. Magrinat, Lindi Adie and/or Burr Medico who agrees with the above.    _______________________________________________________________________ For Office Staff:  Number of people involved in  session: 1 Was an Intern/ student involved with case: no

## 2016-11-19 ENCOUNTER — Ambulatory Visit: Payer: BLUE CROSS/BLUE SHIELD

## 2016-11-25 ENCOUNTER — Ambulatory Visit: Payer: BLUE CROSS/BLUE SHIELD

## 2016-11-25 DIAGNOSIS — Z85828 Personal history of other malignant neoplasm of skin: Secondary | ICD-10-CM | POA: Diagnosis not present

## 2016-11-25 DIAGNOSIS — L570 Actinic keratosis: Secondary | ICD-10-CM | POA: Diagnosis not present

## 2016-11-25 DIAGNOSIS — D2272 Melanocytic nevi of left lower limb, including hip: Secondary | ICD-10-CM | POA: Diagnosis not present

## 2016-11-25 DIAGNOSIS — L814 Other melanin hyperpigmentation: Secondary | ICD-10-CM | POA: Diagnosis not present

## 2016-11-25 DIAGNOSIS — L821 Other seborrheic keratosis: Secondary | ICD-10-CM | POA: Diagnosis not present

## 2016-11-25 DIAGNOSIS — C44519 Basal cell carcinoma of skin of other part of trunk: Secondary | ICD-10-CM | POA: Diagnosis not present

## 2016-11-26 ENCOUNTER — Ambulatory Visit
Admission: RE | Admit: 2016-11-26 | Discharge: 2016-11-26 | Disposition: A | Discharge status: Home / Self Care | Payer: BLUE CROSS/BLUE SHIELD | Source: Ambulatory Visit | Attending: Nurse Practitioner | Admitting: Nurse Practitioner

## 2016-11-26 DIAGNOSIS — Z1231 Encounter for screening mammogram for malignant neoplasm of breast: Secondary | ICD-10-CM

## 2016-12-10 DIAGNOSIS — Z01419 Encounter for gynecological examination (general) (routine) without abnormal findings: Secondary | ICD-10-CM | POA: Diagnosis not present

## 2017-02-04 ENCOUNTER — Other Ambulatory Visit: Payer: Self-pay | Admitting: Nurse Practitioner

## 2017-02-04 DIAGNOSIS — Z803 Family history of malignant neoplasm of breast: Secondary | ICD-10-CM

## 2017-02-05 ENCOUNTER — Other Ambulatory Visit: Payer: BLUE CROSS/BLUE SHIELD

## 2017-02-23 ENCOUNTER — Telehealth: Payer: Self-pay | Admitting: Genetic Counselor

## 2017-02-23 NOTE — Telephone Encounter (Signed)
Patient needs my clinic note sent to the facility where her MRI is going to be performed.  Her MRI is scheduled for Wed morning. I LM on her VM that I need to know which facility we need to fax the note to.  I would be happy to fax it there.

## 2017-02-24 ENCOUNTER — Ambulatory Visit
Admission: RE | Admit: 2017-02-24 | Discharge: 2017-02-24 | Disposition: A | Discharge status: Home / Self Care | Payer: BLUE CROSS/BLUE SHIELD | Source: Ambulatory Visit | Attending: Nurse Practitioner | Admitting: Nurse Practitioner

## 2017-02-24 DIAGNOSIS — Z803 Family history of malignant neoplasm of breast: Secondary | ICD-10-CM | POA: Diagnosis not present

## 2017-02-24 MED ORDER — GADOBENATE DIMEGLUMINE 529 MG/ML IV SOLN
11.0000 mL | Freq: Once | INTRAVENOUS | Status: AC | PRN
  Administered 2017-02-24: 11 mL via INTRAVENOUS

## 2017-03-24 DIAGNOSIS — L910 Hypertrophic scar: Secondary | ICD-10-CM | POA: Diagnosis not present

## 2017-03-24 DIAGNOSIS — L821 Other seborrheic keratosis: Secondary | ICD-10-CM | POA: Diagnosis not present

## 2017-03-24 DIAGNOSIS — L570 Actinic keratosis: Secondary | ICD-10-CM | POA: Diagnosis not present

## 2017-04-16 DIAGNOSIS — Z0000 Encounter for general adult medical examination without abnormal findings: Secondary | ICD-10-CM | POA: Diagnosis not present

## 2017-04-22 DIAGNOSIS — Z1211 Encounter for screening for malignant neoplasm of colon: Secondary | ICD-10-CM | POA: Diagnosis not present

## 2017-04-22 DIAGNOSIS — Z0000 Encounter for general adult medical examination without abnormal findings: Secondary | ICD-10-CM | POA: Diagnosis not present

## 2017-04-22 DIAGNOSIS — M8580 Other specified disorders of bone density and structure, unspecified site: Secondary | ICD-10-CM | POA: Diagnosis not present

## 2017-04-22 DIAGNOSIS — N952 Postmenopausal atrophic vaginitis: Secondary | ICD-10-CM | POA: Diagnosis not present

## 2017-06-03 DIAGNOSIS — Z1239 Encounter for other screening for malignant neoplasm of breast: Secondary | ICD-10-CM | POA: Diagnosis not present

## 2017-06-03 DIAGNOSIS — M8588 Other specified disorders of bone density and structure, other site: Secondary | ICD-10-CM | POA: Diagnosis not present

## 2017-10-07 DIAGNOSIS — Z23 Encounter for immunization: Secondary | ICD-10-CM | POA: Diagnosis not present

## 2017-10-18 ENCOUNTER — Other Ambulatory Visit: Payer: Self-pay | Admitting: Nurse Practitioner

## 2017-10-18 DIAGNOSIS — Z1231 Encounter for screening mammogram for malignant neoplasm of breast: Secondary | ICD-10-CM

## 2017-11-15 DIAGNOSIS — R739 Hyperglycemia, unspecified: Secondary | ICD-10-CM | POA: Diagnosis not present

## 2017-11-15 DIAGNOSIS — M65349 Trigger finger, unspecified ring finger: Secondary | ICD-10-CM | POA: Diagnosis not present

## 2017-11-15 DIAGNOSIS — M25559 Pain in unspecified hip: Secondary | ICD-10-CM | POA: Diagnosis not present

## 2017-11-15 DIAGNOSIS — Z832 Family history of diseases of the blood and blood-forming organs and certain disorders involving the immune mechanism: Secondary | ICD-10-CM | POA: Diagnosis not present

## 2017-11-29 ENCOUNTER — Ambulatory Visit
Admission: RE | Admit: 2017-11-29 | Discharge: 2017-11-29 | Disposition: A | Discharge status: Home / Self Care | Payer: BLUE CROSS/BLUE SHIELD | Source: Ambulatory Visit | Attending: Nurse Practitioner | Admitting: Nurse Practitioner

## 2017-11-29 DIAGNOSIS — Z1231 Encounter for screening mammogram for malignant neoplasm of breast: Secondary | ICD-10-CM

## 2017-12-13 DIAGNOSIS — M65342 Trigger finger, left ring finger: Secondary | ICD-10-CM | POA: Diagnosis not present

## 2017-12-16 ENCOUNTER — Other Ambulatory Visit (HOSPITAL_COMMUNITY)
Admission: RE | Admit: 2017-12-16 | Discharge: 2017-12-16 | Disposition: A | Discharge status: Home / Self Care | Payer: BLUE CROSS/BLUE SHIELD | Source: Ambulatory Visit | Attending: Nurse Practitioner | Admitting: Nurse Practitioner

## 2017-12-16 ENCOUNTER — Other Ambulatory Visit: Payer: Self-pay | Admitting: Nurse Practitioner

## 2017-12-16 DIAGNOSIS — Z01419 Encounter for gynecological examination (general) (routine) without abnormal findings: Secondary | ICD-10-CM | POA: Insufficient documentation

## 2017-12-20 LAB — CYTOLOGY - PAP
Diagnosis: NEGATIVE
HPV: NOT DETECTED

## 2018-01-24 DIAGNOSIS — D3132 Benign neoplasm of left choroid: Secondary | ICD-10-CM | POA: Diagnosis not present

## 2018-01-24 DIAGNOSIS — H43393 Other vitreous opacities, bilateral: Secondary | ICD-10-CM | POA: Diagnosis not present

## 2018-01-24 DIAGNOSIS — H43812 Vitreous degeneration, left eye: Secondary | ICD-10-CM | POA: Diagnosis not present

## 2018-01-24 DIAGNOSIS — H2513 Age-related nuclear cataract, bilateral: Secondary | ICD-10-CM | POA: Diagnosis not present

## 2018-03-02 DIAGNOSIS — Z85828 Personal history of other malignant neoplasm of skin: Secondary | ICD-10-CM | POA: Diagnosis not present

## 2018-03-02 DIAGNOSIS — L821 Other seborrheic keratosis: Secondary | ICD-10-CM | POA: Diagnosis not present

## 2018-03-02 DIAGNOSIS — L570 Actinic keratosis: Secondary | ICD-10-CM | POA: Diagnosis not present

## 2018-03-02 DIAGNOSIS — D225 Melanocytic nevi of trunk: Secondary | ICD-10-CM | POA: Diagnosis not present

## 2018-03-17 DIAGNOSIS — R300 Dysuria: Secondary | ICD-10-CM | POA: Diagnosis not present

## 2018-04-11 DIAGNOSIS — S30861A Insect bite (nonvenomous) of abdominal wall, initial encounter: Secondary | ICD-10-CM | POA: Diagnosis not present

## 2018-06-07 DIAGNOSIS — Z0000 Encounter for general adult medical examination without abnormal findings: Secondary | ICD-10-CM | POA: Diagnosis not present

## 2018-06-07 DIAGNOSIS — E559 Vitamin D deficiency, unspecified: Secondary | ICD-10-CM | POA: Diagnosis not present

## 2018-06-10 DIAGNOSIS — Z1211 Encounter for screening for malignant neoplasm of colon: Secondary | ICD-10-CM | POA: Diagnosis not present

## 2018-06-10 DIAGNOSIS — Z0000 Encounter for general adult medical examination without abnormal findings: Secondary | ICD-10-CM | POA: Diagnosis not present

## 2018-07-12 DIAGNOSIS — Z1231 Encounter for screening mammogram for malignant neoplasm of breast: Secondary | ICD-10-CM | POA: Diagnosis not present

## 2018-11-14 ENCOUNTER — Other Ambulatory Visit: Payer: Self-pay | Admitting: Nurse Practitioner

## 2018-11-14 DIAGNOSIS — Z1231 Encounter for screening mammogram for malignant neoplasm of breast: Secondary | ICD-10-CM

## 2018-11-16 DIAGNOSIS — Z23 Encounter for immunization: Secondary | ICD-10-CM | POA: Diagnosis not present

## 2018-12-20 DIAGNOSIS — Z202 Contact with and (suspected) exposure to infections with a predominantly sexual mode of transmission: Secondary | ICD-10-CM | POA: Diagnosis not present

## 2018-12-20 DIAGNOSIS — Z01419 Encounter for gynecological examination (general) (routine) without abnormal findings: Secondary | ICD-10-CM | POA: Diagnosis not present

## 2019-01-04 ENCOUNTER — Ambulatory Visit
Admission: RE | Admit: 2019-01-04 | Discharge: 2019-01-04 | Disposition: A | Discharge status: Home / Self Care | Payer: BLUE CROSS/BLUE SHIELD | Source: Ambulatory Visit | Attending: Nurse Practitioner | Admitting: Nurse Practitioner

## 2019-01-04 ENCOUNTER — Other Ambulatory Visit: Payer: Self-pay

## 2019-01-04 DIAGNOSIS — Z1211 Encounter for screening for malignant neoplasm of colon: Secondary | ICD-10-CM | POA: Diagnosis not present

## 2019-01-04 DIAGNOSIS — Z1231 Encounter for screening mammogram for malignant neoplasm of breast: Secondary | ICD-10-CM | POA: Diagnosis not present

## 2019-02-17 ENCOUNTER — Other Ambulatory Visit: Payer: Self-pay | Admitting: Nurse Practitioner

## 2019-02-17 DIAGNOSIS — Z8739 Personal history of other diseases of the musculoskeletal system and connective tissue: Secondary | ICD-10-CM

## 2019-02-20 DIAGNOSIS — H0100B Unspecified blepharitis left eye, upper and lower eyelids: Secondary | ICD-10-CM | POA: Diagnosis not present

## 2019-02-20 DIAGNOSIS — H0100A Unspecified blepharitis right eye, upper and lower eyelids: Secondary | ICD-10-CM | POA: Diagnosis not present

## 2019-02-20 DIAGNOSIS — D23122 Other benign neoplasm of skin of left lower eyelid, including canthus: Secondary | ICD-10-CM | POA: Diagnosis not present

## 2019-02-20 DIAGNOSIS — H40032 Anatomical narrow angle, left eye: Secondary | ICD-10-CM | POA: Diagnosis not present

## 2019-03-17 DIAGNOSIS — Z1159 Encounter for screening for other viral diseases: Secondary | ICD-10-CM | POA: Diagnosis not present

## 2019-03-18 ENCOUNTER — Other Ambulatory Visit: Payer: Self-pay

## 2019-03-18 ENCOUNTER — Ambulatory Visit: Payer: BC Managed Care – PPO | Attending: Internal Medicine

## 2019-03-18 DIAGNOSIS — Z23 Encounter for immunization: Secondary | ICD-10-CM

## 2019-03-18 NOTE — Progress Notes (Signed)
   Covid-19 Vaccination Clinic  Name:  BRANDICE BUSSER    MRN: [id number] DOB: 11-09-1955  03/18/2019  Ms. Wilmott was observed post Covid-19 immunization for 15 minutes without incident. She was provided with Vaccine Information Sheet and instruction to access the V-Safe system.   Ms. Dubreuil was instructed to call 911 with any severe reactions post vaccine: Marland Kitchen Difficulty breathing  . Swelling of face and throat  . A fast heartbeat  . A bad rash all over body  . Dizziness and weakness   Immunizations Administered    Name Date Dose VIS Date Route   Moderna COVID-19 Vaccine 03/18/2019 10:36 AM 0.5 mL 12/13/2018 Intramuscular   Manufacturer: Moderna   Lot: [id number]   NDC: 45809-983-38

## 2019-03-22 DIAGNOSIS — D120 Benign neoplasm of cecum: Secondary | ICD-10-CM | POA: Diagnosis not present

## 2019-03-22 DIAGNOSIS — D125 Benign neoplasm of sigmoid colon: Secondary | ICD-10-CM | POA: Diagnosis not present

## 2019-03-22 DIAGNOSIS — K635 Polyp of colon: Secondary | ICD-10-CM | POA: Diagnosis not present

## 2019-03-22 DIAGNOSIS — R195 Other fecal abnormalities: Secondary | ICD-10-CM | POA: Diagnosis not present

## 2019-04-19 ENCOUNTER — Ambulatory Visit: Payer: Self-pay | Attending: Internal Medicine

## 2019-04-19 DIAGNOSIS — Z23 Encounter for immunization: Secondary | ICD-10-CM

## 2019-04-19 NOTE — Progress Notes (Signed)
   Covid-19 Vaccination Clinic  Name:  Joy Arroyo    MRN: [id number] DOB: [birth year]  04/19/2019  [name] was observed post Covid-19 immunization for 15 minutes without incident. She was provided with Vaccine Information Sheet and instruction to access the V-Safe system.   [name] was instructed to call 911 with any severe reactions post vaccine: Marland Kitchen Difficulty breathing  . Swelling of face and throat  . A fast heartbeat  . A bad rash all over body  . Dizziness and weakness   Immunizations Administered    Name Date Dose VIS Date Route   Moderna COVID-19 Vaccine 04/19/2019 10:38 AM 0.5 mL 12/13/2018 Intramuscular   Manufacturer: Gala Murdoch   Lot: [id number]   NDC: 00484-986-51

## 2019-05-17 DIAGNOSIS — D225 Melanocytic nevi of trunk: Secondary | ICD-10-CM | POA: Diagnosis not present

## 2019-05-17 DIAGNOSIS — L570 Actinic keratosis: Secondary | ICD-10-CM | POA: Diagnosis not present

## 2019-05-17 DIAGNOSIS — D2272 Melanocytic nevi of left lower limb, including hip: Secondary | ICD-10-CM | POA: Diagnosis not present

## 2019-05-17 DIAGNOSIS — Z85828 Personal history of other malignant neoplasm of skin: Secondary | ICD-10-CM | POA: Diagnosis not present

## 2019-05-17 DIAGNOSIS — L814 Other melanin hyperpigmentation: Secondary | ICD-10-CM | POA: Diagnosis not present

## 2019-06-07 DIAGNOSIS — Z13220 Encounter for screening for lipoid disorders: Secondary | ICD-10-CM | POA: Diagnosis not present

## 2019-06-07 DIAGNOSIS — Z1159 Encounter for screening for other viral diseases: Secondary | ICD-10-CM | POA: Diagnosis not present

## 2019-06-07 DIAGNOSIS — E559 Vitamin D deficiency, unspecified: Secondary | ICD-10-CM | POA: Diagnosis not present

## 2019-06-07 DIAGNOSIS — Z0000 Encounter for general adult medical examination without abnormal findings: Secondary | ICD-10-CM | POA: Diagnosis not present

## 2019-06-15 DIAGNOSIS — Z1231 Encounter for screening mammogram for malignant neoplasm of breast: Secondary | ICD-10-CM | POA: Diagnosis not present

## 2019-06-15 DIAGNOSIS — Z0000 Encounter for general adult medical examination without abnormal findings: Secondary | ICD-10-CM | POA: Diagnosis not present

## 2019-06-15 DIAGNOSIS — L658 Other specified nonscarring hair loss: Secondary | ICD-10-CM | POA: Diagnosis not present

## 2019-08-03 ENCOUNTER — Other Ambulatory Visit: Payer: Self-pay

## 2019-08-03 ENCOUNTER — Ambulatory Visit
Admission: RE | Admit: 2019-08-03 | Discharge: 2019-08-03 | Disposition: A | Discharge status: Home / Self Care | Payer: BC Managed Care – PPO | Source: Ambulatory Visit | Attending: Nurse Practitioner | Admitting: Nurse Practitioner

## 2019-08-03 DIAGNOSIS — Z8739 Personal history of other diseases of the musculoskeletal system and connective tissue: Secondary | ICD-10-CM

## 2019-08-03 DIAGNOSIS — M8589 Other specified disorders of bone density and structure, multiple sites: Secondary | ICD-10-CM | POA: Diagnosis not present

## 2019-08-03 DIAGNOSIS — Z780 Asymptomatic menopausal state: Secondary | ICD-10-CM | POA: Diagnosis not present

## 2019-11-21 DIAGNOSIS — D1801 Hemangioma of skin and subcutaneous tissue: Secondary | ICD-10-CM | POA: Diagnosis not present

## 2019-11-21 DIAGNOSIS — L649 Androgenic alopecia, unspecified: Secondary | ICD-10-CM | POA: Diagnosis not present

## 2019-11-21 DIAGNOSIS — Z85828 Personal history of other malignant neoplasm of skin: Secondary | ICD-10-CM | POA: Diagnosis not present

## 2019-11-21 DIAGNOSIS — L918 Other hypertrophic disorders of the skin: Secondary | ICD-10-CM | POA: Diagnosis not present

## 2019-11-21 DIAGNOSIS — D224 Melanocytic nevi of scalp and neck: Secondary | ICD-10-CM | POA: Diagnosis not present

## 2019-11-21 DIAGNOSIS — L700 Acne vulgaris: Secondary | ICD-10-CM | POA: Diagnosis not present

## 2019-11-21 DIAGNOSIS — L639 Alopecia areata, unspecified: Secondary | ICD-10-CM | POA: Diagnosis not present

## 2019-11-21 DIAGNOSIS — Z79899 Other long term (current) drug therapy: Secondary | ICD-10-CM | POA: Diagnosis not present

## 2019-12-06 ENCOUNTER — Other Ambulatory Visit: Payer: Self-pay | Admitting: Cardiology

## 2019-12-06 ENCOUNTER — Other Ambulatory Visit: Payer: Self-pay | Admitting: Obstetrics and Gynecology

## 2019-12-06 DIAGNOSIS — Z1231 Encounter for screening mammogram for malignant neoplasm of breast: Secondary | ICD-10-CM

## 2020-01-22 ENCOUNTER — Ambulatory Visit
Admission: RE | Admit: 2020-01-22 | Discharge: 2020-01-22 | Disposition: A | Discharge status: Home / Self Care | Payer: BC Managed Care – PPO | Source: Ambulatory Visit | Attending: Obstetrics and Gynecology | Admitting: Obstetrics and Gynecology

## 2020-01-22 ENCOUNTER — Other Ambulatory Visit: Payer: Self-pay

## 2020-01-22 DIAGNOSIS — Z1231 Encounter for screening mammogram for malignant neoplasm of breast: Secondary | ICD-10-CM

## 2020-02-02 DIAGNOSIS — Z01419 Encounter for gynecological examination (general) (routine) without abnormal findings: Secondary | ICD-10-CM | POA: Diagnosis not present

## 2020-02-02 DIAGNOSIS — Z124 Encounter for screening for malignant neoplasm of cervix: Secondary | ICD-10-CM | POA: Diagnosis not present

## 2020-04-04 DIAGNOSIS — R3989 Other symptoms and signs involving the genitourinary system: Secondary | ICD-10-CM | POA: Diagnosis not present

## 2020-06-13 DIAGNOSIS — E559 Vitamin D deficiency, unspecified: Secondary | ICD-10-CM | POA: Diagnosis not present

## 2020-06-20 DIAGNOSIS — Z0000 Encounter for general adult medical examination without abnormal findings: Secondary | ICD-10-CM | POA: Diagnosis not present

## 2020-08-02 DIAGNOSIS — Z9189 Other specified personal risk factors, not elsewhere classified: Secondary | ICD-10-CM | POA: Diagnosis not present

## 2020-08-02 DIAGNOSIS — F5101 Primary insomnia: Secondary | ICD-10-CM | POA: Diagnosis not present

## 2020-12-26 ENCOUNTER — Other Ambulatory Visit: Payer: Self-pay | Admitting: Obstetrics and Gynecology

## 2020-12-26 ENCOUNTER — Other Ambulatory Visit: Payer: Self-pay | Admitting: Nurse Practitioner

## 2020-12-26 DIAGNOSIS — Z1231 Encounter for screening mammogram for malignant neoplasm of breast: Secondary | ICD-10-CM

## 2021-01-01 DIAGNOSIS — Z23 Encounter for immunization: Secondary | ICD-10-CM | POA: Diagnosis not present

## 2021-01-01 DIAGNOSIS — M791 Myalgia, unspecified site: Secondary | ICD-10-CM | POA: Diagnosis not present

## 2021-01-29 ENCOUNTER — Ambulatory Visit
Admission: RE | Admit: 2021-01-29 | Discharge: 2021-01-29 | Disposition: A | Discharge status: Home / Self Care | Payer: Medicare Other | Source: Ambulatory Visit | Attending: Nurse Practitioner | Admitting: Nurse Practitioner

## 2021-01-29 DIAGNOSIS — Z1231 Encounter for screening mammogram for malignant neoplasm of breast: Secondary | ICD-10-CM

## 2021-02-06 DIAGNOSIS — Z1331 Encounter for screening for depression: Secondary | ICD-10-CM | POA: Diagnosis not present

## 2021-02-06 DIAGNOSIS — Z124 Encounter for screening for malignant neoplasm of cervix: Secondary | ICD-10-CM | POA: Diagnosis not present

## 2021-02-06 DIAGNOSIS — Z0000 Encounter for general adult medical examination without abnormal findings: Secondary | ICD-10-CM | POA: Diagnosis not present

## 2021-02-06 DIAGNOSIS — Z1339 Encounter for screening examination for other mental health and behavioral disorders: Secondary | ICD-10-CM | POA: Diagnosis not present

## 2021-03-05 DIAGNOSIS — C44519 Basal cell carcinoma of skin of other part of trunk: Secondary | ICD-10-CM | POA: Diagnosis not present

## 2021-03-05 DIAGNOSIS — Z85828 Personal history of other malignant neoplasm of skin: Secondary | ICD-10-CM | POA: Diagnosis not present

## 2021-03-05 DIAGNOSIS — D2372 Other benign neoplasm of skin of left lower limb, including hip: Secondary | ICD-10-CM | POA: Diagnosis not present

## 2021-03-05 DIAGNOSIS — L814 Other melanin hyperpigmentation: Secondary | ICD-10-CM | POA: Diagnosis not present

## 2021-03-05 DIAGNOSIS — L570 Actinic keratosis: Secondary | ICD-10-CM | POA: Diagnosis not present

## 2021-03-05 DIAGNOSIS — L821 Other seborrheic keratosis: Secondary | ICD-10-CM | POA: Diagnosis not present

## 2021-03-05 DIAGNOSIS — C44619 Basal cell carcinoma of skin of left upper limb, including shoulder: Secondary | ICD-10-CM | POA: Diagnosis not present

## 2021-03-11 DIAGNOSIS — C44619 Basal cell carcinoma of skin of left upper limb, including shoulder: Secondary | ICD-10-CM | POA: Diagnosis not present

## 2021-03-25 DIAGNOSIS — J0100 Acute maxillary sinusitis, unspecified: Secondary | ICD-10-CM | POA: Diagnosis not present

## 2021-08-08 ENCOUNTER — Other Ambulatory Visit: Payer: Self-pay | Admitting: Nurse Practitioner

## 2021-08-08 DIAGNOSIS — Z1239 Encounter for other screening for malignant neoplasm of breast: Secondary | ICD-10-CM

## 2021-08-08 DIAGNOSIS — Z13820 Encounter for screening for osteoporosis: Secondary | ICD-10-CM

## 2021-08-15 ENCOUNTER — Other Ambulatory Visit: Payer: Self-pay | Admitting: Nurse Practitioner

## 2021-08-15 DIAGNOSIS — Z1231 Encounter for screening mammogram for malignant neoplasm of breast: Secondary | ICD-10-CM

## 2021-08-20 ENCOUNTER — Ambulatory Visit
Admission: RE | Admit: 2021-08-20 | Discharge: 2021-08-20 | Disposition: A | Discharge status: Home / Self Care | Payer: Medicare Other | Source: Ambulatory Visit | Attending: Nurse Practitioner | Admitting: Nurse Practitioner

## 2021-08-20 DIAGNOSIS — Z1239 Encounter for other screening for malignant neoplasm of breast: Secondary | ICD-10-CM

## 2021-08-20 MED ORDER — GADOBUTROL 1 MMOL/ML IV SOLN
5.0000 mL | Freq: Once | INTRAVENOUS | Status: AC | PRN
  Administered 2021-08-20: 5 mL via INTRAVENOUS

## 2021-11-05 ENCOUNTER — Other Ambulatory Visit: Payer: Self-pay | Admitting: Family Medicine

## 2021-11-05 DIAGNOSIS — R29898 Other symptoms and signs involving the musculoskeletal system: Secondary | ICD-10-CM

## 2021-11-18 ENCOUNTER — Ambulatory Visit
Admission: RE | Admit: 2021-11-18 | Discharge: 2021-11-18 | Disposition: A | Discharge status: Home / Self Care | Payer: Medicare Other | Source: Ambulatory Visit | Attending: Family Medicine | Admitting: Family Medicine

## 2021-11-18 DIAGNOSIS — R29898 Other symptoms and signs involving the musculoskeletal system: Secondary | ICD-10-CM

## 2022-02-02 ENCOUNTER — Ambulatory Visit
Admission: RE | Admit: 2022-02-02 | Discharge: 2022-02-02 | Disposition: A | Discharge status: Home / Self Care | Payer: Medicare Other | Source: Ambulatory Visit | Attending: Nurse Practitioner | Admitting: Nurse Practitioner

## 2022-02-02 ENCOUNTER — Other Ambulatory Visit: Payer: Medicare Other

## 2022-02-02 DIAGNOSIS — Z1231 Encounter for screening mammogram for malignant neoplasm of breast: Secondary | ICD-10-CM

## 2022-02-10 ENCOUNTER — Ambulatory Visit
Admission: RE | Admit: 2022-02-10 | Discharge: 2022-02-10 | Disposition: A | Discharge status: Home / Self Care | Payer: Medicare Other | Source: Ambulatory Visit | Attending: Nurse Practitioner | Admitting: Nurse Practitioner

## 2022-02-10 DIAGNOSIS — Z13820 Encounter for screening for osteoporosis: Secondary | ICD-10-CM

## 2022-05-27 ENCOUNTER — Other Ambulatory Visit: Payer: Self-pay | Admitting: Pain Medicine

## 2022-05-27 ENCOUNTER — Ambulatory Visit
Admission: RE | Admit: 2022-05-27 | Discharge: 2022-05-27 | Disposition: A | Discharge status: Home / Self Care | Payer: Medicare Other | Source: Ambulatory Visit | Attending: Pain Medicine | Admitting: Pain Medicine

## 2022-05-27 DIAGNOSIS — G4489 Other headache syndrome: Secondary | ICD-10-CM

## 2022-05-27 DIAGNOSIS — M542 Cervicalgia: Secondary | ICD-10-CM

## 2023-01-25 ENCOUNTER — Other Ambulatory Visit: Payer: Self-pay | Admitting: Family Medicine

## 2023-01-25 DIAGNOSIS — Z1231 Encounter for screening mammogram for malignant neoplasm of breast: Secondary | ICD-10-CM

## 2023-02-10 ENCOUNTER — Ambulatory Visit: Payer: Medicare Other

## 2023-03-10 ENCOUNTER — Ambulatory Visit
Admission: RE | Admit: 2023-03-10 | Discharge: 2023-03-10 | Disposition: A | Discharge status: Home / Self Care | Payer: Medicare Other | Source: Ambulatory Visit | Attending: Family Medicine | Admitting: Family Medicine

## 2023-03-10 DIAGNOSIS — Z1231 Encounter for screening mammogram for malignant neoplasm of breast: Secondary | ICD-10-CM

## 2023-06-26 ENCOUNTER — Emergency Department (HOSPITAL_BASED_OUTPATIENT_CLINIC_OR_DEPARTMENT_OTHER)

## 2023-06-26 ENCOUNTER — Emergency Department (HOSPITAL_BASED_OUTPATIENT_CLINIC_OR_DEPARTMENT_OTHER)
Admission: EM | Admit: 2023-06-26 | Discharge: 2023-06-26 | Disposition: A | Discharge status: Home / Self Care | Attending: Emergency Medicine | Admitting: Emergency Medicine

## 2023-06-26 ENCOUNTER — Other Ambulatory Visit: Payer: Self-pay

## 2023-06-26 ENCOUNTER — Encounter (HOSPITAL_BASED_OUTPATIENT_CLINIC_OR_DEPARTMENT_OTHER): Payer: Self-pay | Admitting: Emergency Medicine

## 2023-06-26 ENCOUNTER — Emergency Department (HOSPITAL_COMMUNITY)

## 2023-06-26 DIAGNOSIS — R110 Nausea: Secondary | ICD-10-CM | POA: Diagnosis present

## 2023-06-26 DIAGNOSIS — W57XXXA Bitten or stung by nonvenomous insect and other nonvenomous arthropods, initial encounter: Secondary | ICD-10-CM | POA: Insufficient documentation

## 2023-06-26 DIAGNOSIS — G44209 Tension-type headache, unspecified, not intractable: Secondary | ICD-10-CM | POA: Diagnosis not present

## 2023-06-26 DIAGNOSIS — R42 Dizziness and giddiness: Secondary | ICD-10-CM | POA: Insufficient documentation

## 2023-06-26 DIAGNOSIS — R519 Headache, unspecified: Secondary | ICD-10-CM | POA: Insufficient documentation

## 2023-06-26 DIAGNOSIS — Z7982 Long term (current) use of aspirin: Secondary | ICD-10-CM | POA: Insufficient documentation

## 2023-06-26 DIAGNOSIS — T1490XA Injury, unspecified, initial encounter: Secondary | ICD-10-CM | POA: Insufficient documentation

## 2023-06-26 LAB — DIFFERENTIAL
Abs Immature Granulocytes: 0.03 10*3/uL (ref 0.00–0.07)
Basophils Absolute: 0 10*3/uL (ref 0.0–0.1)
Basophils Relative: 0 %
Eosinophils Absolute: 0.1 10*3/uL (ref 0.0–0.5)
Eosinophils Relative: 1 %
Immature Granulocytes: 0 %
Lymphocytes Relative: 16 %
Lymphs Abs: 1 10*3/uL (ref 0.7–4.0)
Monocytes Absolute: 0.5 10*3/uL (ref 0.1–1.0)
Monocytes Relative: 7 %
Neutro Abs: 5.1 10*3/uL (ref 1.7–7.7)
Neutrophils Relative %: 76 %

## 2023-06-26 LAB — COMPREHENSIVE METABOLIC PANEL WITH GFR
ALT: 13 U/L (ref 0–44)
AST: 17 U/L (ref 15–41)
Albumin: 4.3 g/dL (ref 3.5–5.0)
Alkaline Phosphatase: 54 U/L (ref 38–126)
Anion gap: 13 (ref 5–15)
BUN: 9 mg/dL (ref 8–23)
CO2: 23 mmol/L (ref 22–32)
Calcium: 9.8 mg/dL (ref 8.9–10.3)
Chloride: 105 mmol/L (ref 98–111)
Creatinine, Ser: 0.68 mg/dL (ref 0.44–1.00)
GFR, Estimated: >60 mL/min (ref >60)
Glucose, Bld: 106 mg/dL — ABNORMAL HIGH (ref 70–99)
Potassium: 4 mmol/L (ref 3.5–5.1)
Sodium: 141 mmol/L (ref 135–145)
Total Bilirubin: 1.3 mg/dL — ABNORMAL HIGH (ref 0.0–1.2)
Total Protein: 7 g/dL (ref 6.5–8.1)

## 2023-06-26 LAB — CBC
HCT: 41.1 % (ref 36.0–46.0)
Hemoglobin: 14.6 g/dL (ref 12.0–15.0)
MCH: 32.3 pg (ref 26.0–34.0)
MCHC: 35.5 g/dL (ref 30.0–36.0)
MCV: 90.9 fL (ref 80.0–100.0)
Platelets: 275 10*3/uL (ref 150–400)
RBC: 4.52 MIL/uL (ref 3.87–5.11)
RDW: 12.2 % (ref 11.5–15.5)
WBC: 6.7 10*3/uL (ref 4.0–10.5)
nRBC: 0 % (ref 0.0–0.2)

## 2023-06-26 LAB — URINE DRUG SCREEN
Amphetamines: NOT DETECTED
Barbiturates: NOT DETECTED
Benzodiazepines: NOT DETECTED
Cocaine: NOT DETECTED
Fentanyl: NOT DETECTED
Methadone Scn, Ur: NOT DETECTED
Opiates: NOT DETECTED
Tetrahydrocannabinol: DETECTED — AB

## 2023-06-26 LAB — PROTIME-INR
INR: 1 (ref 0.8–1.2)
Prothrombin Time: 13.6 s (ref 11.4–15.2)

## 2023-06-26 LAB — ETHANOL: Alcohol, Ethyl (B): <15 mg/dL (ref <15)

## 2023-06-26 LAB — APTT: aPTT: 27 s (ref 24–36)

## 2023-06-26 MED ORDER — KETOROLAC TROMETHAMINE 15 MG/ML IJ SOLN
15.0000 mg | Freq: Once | INTRAMUSCULAR | Status: AC
  Filled 2023-06-26: qty 1

## 2023-06-26 MED ORDER — DIPHENHYDRAMINE HCL 50 MG/ML IJ SOLN
12.5000 mg | Freq: Once | INTRAMUSCULAR | Status: AC
  Filled 2023-06-26: qty 1

## 2023-06-26 MED ORDER — MECLIZINE HCL 25 MG PO TABS
25.0000 mg | ORAL_TABLET | Freq: Once | ORAL | Status: AC
  Filled 2023-06-26: qty 1

## 2023-06-26 MED ORDER — MECLIZINE HCL 25 MG PO TABS: 25.0000 mg | ORAL_TABLET | Freq: Three times a day (TID) | ORAL | 0 refills | Status: AC | PRN

## 2023-06-26 MED ORDER — ONDANSETRON 4 MG PO TBDP
4.0000 mg | ORAL_TABLET | Freq: Once | ORAL | Status: AC
  Filled 2023-06-26: qty 1

## 2023-06-26 MED ORDER — ACETAMINOPHEN 500 MG PO TABS
1000.0000 mg | ORAL_TABLET | Freq: Once | ORAL | Status: AC
  Filled 2023-06-26: qty 2

## 2023-06-26 MED ORDER — METOCLOPRAMIDE HCL 5 MG/ML IJ SOLN
10.0000 mg | Freq: Once | INTRAMUSCULAR | Status: AC
  Filled 2023-06-26: qty 2

## 2023-06-26 MED ORDER — LORAZEPAM 1 MG PO TABS
1.0000 mg | ORAL_TABLET | ORAL | Status: AC | PRN
  Filled 2023-06-26: qty 1

## 2023-06-26 MED ORDER — DOXYCYCLINE HYCLATE 100 MG PO TABS
100.0000 mg | ORAL_TABLET | Freq: Once | ORAL | Status: AC
  Filled 2023-06-26: qty 1

## 2023-06-26 MED ORDER — DOXYCYCLINE HYCLATE 100 MG PO CAPS: 100.0000 mg | ORAL_CAPSULE | Freq: Two times a day (BID) | ORAL | 0 refills | Status: AC

## 2023-06-26 MED ORDER — SODIUM CHLORIDE 0.9 % IV BOLUS: 1000.0000 mL | Freq: Once | INTRAVENOUS | Status: AC

## 2023-06-26 MED ORDER — IOHEXOL 350 MG/ML SOLN: 75.0000 mL | Freq: Once | INTRAVENOUS | Status: AC | PRN

## 2023-06-26 MED ORDER — ONDANSETRON 4 MG PO TBDP
4.0000 mg | ORAL_TABLET | Freq: Three times a day (TID) | ORAL | 0 refills | Status: AC | PRN
Start: 2023-06-26 — End: ?

## 2023-06-26 NOTE — ED Provider Notes (Signed)
 [place] EMERGENCY DEPARTMENT AT [facility] Provider Note   CSN: [id number] Arrival date & time: 06/26/23  8295     Patient presents with: Dizziness   Joy Arroyo is a 68 y.o. female.    Dizziness Associated symptoms: headaches and nausea   Associated symptoms: no diarrhea      68 year old female with medical history significant for HSV presenting to the emergency department with multiple complaints.  The patient states that she has had multiple tick bites found without rash on her skin over the past 4 weeks.  Roughly 3 to 4 days ago she developed a gradual onset headache located in a bandlike pattern across her forehead.  She endorsed light sensitivity and sound sensitivity with associated nausea, some watery emesis.  She has had some abdominal cramping, no diarrhea.  Yesterday morning, she woke up and developed unsteadiness, no clear room spinning dizziness but persistent symptoms of disequilibrium.  She noted that she was falling persistently to the right.  Symptoms have persisted and have not improved.  She continues to endorse headache.  She denies any fevers.  She endorses some neck discomfort.  Prior to Admission medications   Medication Sig Start Date End Date Taking? Authorizing Provider  ALPRAZolam (XANAX) 0.5 MG tablet Take 0.5 mg by mouth. 1/2 to 1 tablet every 4-6 hours prn anxiety      [provider]  aspirin 81 MG tablet Take 81 mg by mouth daily.      [provider]  calcium carbonate (OS-CAL) 600 MG TABS Take 600 mg by mouth 2 (two) times daily with a meal.      [provider]  cholecalciferol (VITAMIN D) 1000 UNITS tablet Take 1,000 Units by mouth daily.      [provider]  DOXYCYCLINE HYCLATE PO Take 100 mg by mouth.      [provider]  estradiol (ESTRACE) 0.1 MG/GM vaginal cream Place 2 g vaginally once a week.      [provider]  eszopiclone (LUNESTA) 2 MG TABS Take 2 mg by mouth at  bedtime. Take immediately before bedtime     [provider]  fish oil-omega-3 fatty acids 1000 MG capsule Take 2 g by mouth daily.      [provider]  fluocinonide (LIDEX) 0.05 % cream Apply 1 application topically 2 (two) times daily.      [provider]  hydrOXYzine (VISTARIL) 25 MG/ML injection Inject into the muscle as needed.      [provider]  pseudoephedrine (SUDAFED) 30 MG tablet Take 30 mg by mouth every 4 (four) hours as needed.      [provider]  valACYclovir (VALTREX) 500 MG tablet Take 500 mg by mouth 2 (two) times daily.      [provider]    Allergies: Erythromycin and Percodan [oxycodone-aspirin]    Review of Systems  Eyes:  Positive for photophobia.  Gastrointestinal:  Positive for nausea and rectal pain. Negative for diarrhea.  Musculoskeletal:  Positive for gait problem.  Neurological:  Positive for dizziness and headaches.  All other systems reviewed and are negative.   Updated Vital Signs BP 124/84   Pulse 66   Temp 98.1 F (36.7 C) (Oral)   Resp 13   SpO2 99%   Physical Exam Vitals and nursing note reviewed.  Constitutional:      General: She is not in acute distress.    Appearance: She is well-developed.  HENT:  Head: Normocephalic and atraumatic.     Right Ear: Tympanic membrane, ear canal and external ear normal.     Left Ear: Tympanic membrane, ear canal and external ear normal.   Eyes:     Conjunctiva/sclera: Conjunctivae normal.    Cardiovascular:     Rate and Rhythm: Normal rate and regular rhythm.     Heart sounds: No murmur heard. Pulmonary:     Effort: Pulmonary effort is normal. No respiratory distress.     Breath sounds: Normal breath sounds.  Abdominal:     Palpations: Abdomen is soft.     Tenderness: There is no abdominal tenderness.   Musculoskeletal:        General: No swelling.     Cervical back: Neck supple.   Skin:    General: Skin is warm and dry.      Capillary Refill: Capillary refill takes less than 2 seconds.   Neurological:     Mental Status: She is alert.     Comments: MENTAL STATUS EXAM:    Orientation: Alert and oriented to person, place and time.  Memory: Cooperative, follows commands well.  Language: Speech is clear and language is normal.   CRANIAL NERVES:    CN 2 (Optic): Visual fields intact to confrontation.  CN 3,4,6 (EOM): Pupils equal and reactive to light. Full extraocular eye movement with BIDIRECTIONAL NYSTAGMUS noted CN 5 (Trigeminal): Facial sensation is normal, no weakness of masticatory muscles.  CN 7 (Facial): No facial weakness or asymmetry.  CN 8 (Auditory): Auditory acuity grossly normal.  CN 9,10 (Glossophar): The uvula is midline, the palate elevates symmetrically.  CN 11 (spinal access): Normal sternocleidomastoid and trapezius strength.  CN 12 (Hypoglossal): The tongue is midline. No atrophy or fasciculations.Aaron Aas   MOTOR:  Muscle Strength: 5/5RUE, 5/5LUE, 5/5RLE, 5/5LLE.   COORDINATION:   Intact finger-to-nose, no tremor.   SENSATION:   Intact to light touch all four extremities.  GAIT: Gait not assessed   Psychiatric:        Mood and Affect: Mood normal.     (all labs ordered are listed, but only abnormal results are displayed) Labs Reviewed  COMPREHENSIVE METABOLIC PANEL WITH GFR - Abnormal; Notable for the following components:      Result Value   Glucose, Bld 106 (*)    Total Bilirubin 1.3 (*)    All other components within normal limits  ETHANOL  CBC  DIFFERENTIAL  URINE DRUG SCREEN  ROCKY MTN SPOTTED FVR ABS PNL(IGG+IGM)  PROTIME-INR  APTT    EKG: EKG Interpretation Date/Time:  Saturday June 26 2023 09:53:12 EDT Ventricular Rate:  61 PR Interval:  169 QRS Duration:  87 QT Interval:  400 QTC Calculation: 403 R Axis:   55  Text Interpretation: Sinus rhythm Probable left atrial enlargement Low voltage, precordial leads Borderline T wave abnormalities Confirmed by Rosealee Concha ([id number]) on 06/26/2023 10:34:42 AM  Radiology: CT ANGIO HEAD NECK W WO CM Result Date: 06/26/2023 CLINICAL DATA:  Vertigo, central.  Dizziness, nausea, and headaches. EXAM: CT ANGIOGRAPHY HEAD AND NECK WITH AND WITHOUT CONTRAST TECHNIQUE: Multidetector CT imaging of the head and neck was performed using the standard protocol during bolus administration of intravenous contrast. Multiplanar CT image reconstructions and MIPs were obtained to evaluate the vascular anatomy. Carotid stenosis measurements (when applicable) are obtained utilizing NASCET criteria, using the distal internal carotid diameter as the denominator. RADIATION DOSE REDUCTION: This exam was performed according to the departmental dose-optimization program which includes automated exposure control, adjustment of  the mA and/or kV according to patient size and/or use of iterative reconstruction technique. CONTRAST:  75mL OMNIPAQUE IOHEXOL 350 MG/ML SOLN COMPARISON:  Head CT 05/27/2022 FINDINGS: CT HEAD FINDINGS Brain: There is no evidence of an acute infarct, intracranial hemorrhage, mass, midline shift, or extra-axial fluid collection. Cerebral volume is normal for age. The ventricles are normal in size. Vascular: No hyperdense vessel. Skull: No fracture or suspicious lesion. Sinuses/Orbits: Mild mucosal thickening in the maxillary sinuses. Clear mastoid air cells. Unremarkable orbits. Other: None. Review of the MIP images confirms the above findings CTA NECK FINDINGS Aortic arch: Standard branching. Widely patent brachiocephalic and subclavian arteries. Right carotid system: Patent without evidence of stenosis, dissection, or significant atherosclerosis. Left carotid system: Patent without evidence of stenosis, dissection, or significant atherosclerosis. Vertebral arteries: Patent without evidence of stenosis, dissection, or significant atherosclerosis. Mildly to moderately dominant right vertebral artery. Skeleton: Moderate disc degeneration at  C4-5 and C6-7. Up to moderate facet arthrosis, most notable on the left at C5-6 where there is trace anterolisthesis. Other neck: No evidence of cervical lymphadenopathy or mass. Upper chest: Biapical pleuroparenchymal lung scarring. Review of the MIP images confirms the above findings CTA HEAD FINDINGS Anterior circulation: The internal carotid arteries are widely patent from skull base to carotid termini. ACAs and MCAs are patent without evidence of a proximal branch occlusion or significant proximal stenosis. No aneurysm is identified. Posterior circulation: The intracranial vertebral arteries are widely patent to the basilar. Patent left PICA, bilateral AICA, and bilateral SCA origins are visualized. The basilar artery is widely patent. There are large right and moderate-sized left posterior communicating arteries with hypoplasia or absence of the right P1 segment. Both PCAs are patent without evidence of a significant proximal stenosis. No aneurysm is identified. Venous sinuses: Patent. Anatomic variants: Fetal right PCA. Review of the MIP images confirms the above findings IMPRESSION: 1. Negative CTA of the head and neck. 2. No evidence of acute intracranial abnormality. Electronically Signed   By: Aundra Lee M.D.   On: 06/26/2023 11:42     Procedures   Medications Ordered in the ED  LORazepam (ATIVAN) tablet 1 mg (has no administration in time range)  meclizine (ANTIVERT) tablet 25 mg (25 mg Oral Given 06/26/23 1009)  metoCLOPramide (REGLAN) injection 10 mg (10 mg Intravenous Given 06/26/23 1009)  diphenhydrAMINE (BENADRYL) injection 12.5 mg (12.5 mg Intravenous Given 06/26/23 1009)  acetaminophen (TYLENOL) tablet 1,000 mg (1,000 mg Oral Given 06/26/23 1009)  iohexol (OMNIPAQUE) 350 MG/ML injection 75 mL (75 mLs Intravenous Contrast Given 06/26/23 1103)                                    Medical Decision Making Amount and/or Complexity of Data Reviewed Labs: ordered. Radiology:  ordered.  Risk OTC drugs. Prescription drug management.    68 year old female with medical history significant for HSV presenting to the emergency department with multiple complaints.  The patient states that she has had multiple tick bites found without rash on her skin over the past 4 weeks.  Roughly 3 to 4 days ago she developed a gradual onset headache located in a bandlike pattern across her forehead.  She endorsed light sensitivity and sound sensitivity with associated nausea, some watery emesis.  She has had some abdominal cramping, no diarrhea.  Yesterday morning, she woke up and developed unsteadiness, no clear room spinning dizziness but persistent symptoms of disequilibrium.  She noted that  she was falling persistently to the right.  Symptoms have persisted and have not improved.  She continues to endorse headache.  She denies any fevers.  She endorses some neck discomfort.  On arrival, the patient was afebrile, not tachycardic or tachypneic, BP 148/92, saturating high percent on room air.  On exam, the patient has no meningismus, did have bidirectional nystagmus, no dysmetria.  Headache was not sudden onset or maximal in onset, low concern for subarachnoid considered cerebellar CVA given the patient's to the right consistently.  Patient presents outside the window for TNK administration.  Will obtain CTA imaging as well as laboratory evaluation.  Labs: CMP without electrolyte abnormality, CBG 106, CBC without a leukocytosis or anemia, ethanol normal.  Patient was administered migraine cocktail in addition to meclizine.  CTA Head and Neck: IMPRESSION:  1. Negative CTA of the head and neck.  2. No evidence of acute intracranial abnormality.   Neuro consult: Spoke with Dr. Alecia Ames of neurology, recommended MRI brain to rule out posterior CVA.  Given the tick exposure, will prescribe Doxycycline. She has had no rash.  Symptoms certainly could be due to vertiginous migraine  headache however patient will benefit from MRI imaging to rule out posterior CVA.  Spoke to Dr. Reba Camper who accepted the patient in ER to ER transfer.  Patient and family updated regarding the plan of care.     Final diagnoses:  Dysequilibrium  Acute non intractable tension-type headache  Tick bite, unspecified site, initial encounter    ED Discharge Orders     None          Rosealee Concha, MD 06/26/23 1247

## 2023-06-26 NOTE — ED Triage Notes (Signed)
 C/o increased dizziness and nausea since yesterday. Headache. Denies fever. Recent tick bites.

## 2023-06-26 NOTE — ED Provider Notes (Signed)
 Patient transferred from med center for MRI.  She is feeling better feel little bit dehydrated.  Sent here for MRI to rule out stroke.  Dizziness headache possible vertigo versus stroke.  Possible tickborne exposure here recently will likely empirically treat with doxycycline but she has no rash no fever no concern for meningitis or other emergent process at this time.  Will give her IV fluids as we await MRI.  MRI negative for stroke.  Will treat her for peripheral vertigo.  Meclizine Zofran doxycycline given.  Discharge. Better.  This chart was dictated using voice recognition software.  Despite best efforts to proofread,  errors can occur which can change the documentation meaning.    Lowery Rue, DO 06/26/23 [id number]

## 2023-06-26 NOTE — Discharge Instructions (Addendum)
 You are being transported to the emergency department at [facility] for MRI of the brain for further workup of a potential stroke.  Doxycycline has been prescribed as prophylaxis due to your multiple tick bites

## 2023-06-29 ENCOUNTER — Ambulatory Visit: Payer: Self-pay

## 2023-06-29 NOTE — Telephone Encounter (Signed)
   Copied from CRM [id number]. Topic: Clinical - Red Word Triage >> Jun 29, 2023  1:32 PM Turkey B wrote: [facility] that prompted transfer to Nurse Triage: pt has dizziness , high blood pressure and neck pain at a 5 Reason for Disposition  [1] Angry or rude caller AND [2] doesn't respond to 5 minutes of triager counseling AND [3] sick adult (or caller)  Answer Assessment - Initial Assessment Questions 1. SITUATION:  Document reason for call.     ER 06/26/23 called for lab results and discuss symptoms. Patient PCP is not [facility] [place] practice.   2.. ASSESSMENT: Document your nursing assessment.     Chart reviewed, labs do not have any notation to provide patient.   3. RESPONSE: Document what your response or recommendation was.     Advised to call her PCP office for lab results, EKG interpretation and to discuss continued symptoms. Patient became upset with Clinical research associate and stated she can see all her lab results and doesn't understand why this writer cannot provide interpretations, attempted to explain she is not speaking with the ER but community line, offered to connect to ER which she accepted than when this writer asked to confirm her call back number in case we were disconnected she stated you know what nevermind, I will just lay here and die, until I go to my doctor tomorrow She then proceeded to disconnect call.  Protocols used: Difficult Call-A-AH

## 2023-07-28 ENCOUNTER — Encounter: Payer: Self-pay | Admitting: Physical Therapy

## 2023-07-28 ENCOUNTER — Ambulatory Visit: Attending: Orthopedic Surgery | Admitting: Physical Therapy

## 2023-07-28 DIAGNOSIS — M25511 Pain in right shoulder: Secondary | ICD-10-CM | POA: Diagnosis present

## 2023-07-28 NOTE — Therapy (Signed)
 OUTPATIENT PHYSICAL THERAPY SHOULDER EVALUATION   Patient Name: Joy Arroyo MRN: [id number] DOB:1955/09/26, 68 y.o., female Today's Date: 07/28/2023  END OF SESSION:  PT End of Session - 07/28/23 1323     Visit Number 1    Number of Visits 4    Date for PT Re-Evaluation 08/25/23    PT Start Time 1247    PT Stop Time 1341    PT Time Calculation (min) 54 min    Activity Tolerance Patient tolerated treatment well    Behavior During Therapy [facility] for tasks assessed/performed          Past Medical History:  Diagnosis Date   Atrophic vaginitis    Breast lump    Diverticula of colon    Family history of breast cancer    HSV (herpes simplex virus) infection    Osteopenia    Radial head fracture    S/P hernia repair    Past Surgical History:  Procedure Laterality Date   BREAST EXCISIONAL BIOPSY Left    Patient Active Problem List   Diagnosis Date Noted   Family history of breast cancer    Arthritis 05/14/2010   [facility] spotted fever) 05/14/2010   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  THERAPY DIAG:  Acute pain of right shoulder  Rationale for Evaluation and Treatment: Rehabilitation  ONSET DATE: April of 2025.  SUBJECTIVE:                                                                                                                                                                                      SUBJECTIVE STATEMENT: The patient presents to the clinic with c/o right shoulder pain that came on in April which correlated with heavy yardwork and  then  got worse with daily work.  Her pain at rest is a 2 today but can rise to higher levels with overuse.  Her pain increases when trying to sleep on her right shoulder.  She experiences pain into her Biceps.    PERTINENT HISTORY: See above.  PAIN:  Are you having pain? Yes: NPRS scale: 2/10. Pain location: Right shoulder. Pain description: Ache, sore. Aggravating factors: Overuse. Relieving factors: TENS unit.  PRECAUTIONS: None  RED FLAGS: None   WEIGHT BEARING RESTRICTIONS: No  FALLS:  Has  patient fallen in last 6 months? No  LIVING ENVIRONMENT: Lives with: lives with their spouse Lives in: House/apartment Has following equipment at home: None   PLOF: Independent  PATIENT GOALS:Workout without pain (ie:  do planks).    NEXT MD VISIT:   OBJECTIVE:   PATIENT SURVEYS:  Quick DASH:  34  POSTURE: Generally good posture.  UPPER EXTREMITY ROM:   Full active right shoulder range of motion with pain at endrange flexion.  UPPER EXTREMITY MMT:  Normal right shoulder and elbow strength via manual muscle testing.  SHOULDER SPECIAL TESTS: Some pain reproduction with a right shoulder Impingement test.    PALPATION:  Tender to palpation over bicipital groove, acromial ridge and middle deltoid with referred pain into right biceps.  Increased tone in posterior cuff musculature.                                                                                                                               TREATMENT DATE: 07/28/23:  IFC at 80-150 Hz on 40% scan x 18 minutes to patient's right shoulder f/b STW/M x 8 minutes.  Normal modality response following removal of modality.   PATIENT EDUCATION: Education details: Discussed HEP and short duration ice massage.   Person educated: Patient Education method: Explanation Education comprehension: verbalized understanding  HOME EXERCISE PROGRAM:   ASSESSMENT:  CLINICAL IMPRESSION: The patient presents to OPPT with c/o  right shoulder pain since April of this year.  She is very motivated to improve as she would like to get back to working out like she did before her shoulder began hurting.  She exhibits essentially full active right shoulder range of motion and no strength deficits. She has some pain with a right shoulder Impingement test.  She is ender to palpation over bicipital groove, acromial ridge and middle deltoid with referred pain into right biceps.  Patient plans to limit visits and have an HEP prescribed.  She has increased tone in posterior cuff musculature. Patient will benefit from skilled physical therapy intervention to address pain and deficits.  OBJECTIVE IMPAIRMENTS: decreased activity tolerance and pain.   ACTIVITY LIMITATIONS: lifting  PARTICIPATION LIMITATIONS: cleaning and yard work  [facility] POTENTIAL: Excellent  CLINICAL DECISION MAKING: Evolving/moderate complexity  EVALUATION COMPLEXITY: Moderate   GOALS:  SHORT TERM GOALS: Target date: 08/25/23:  Ind with a HEP. Goal status: INITIAL  PLAN:  PT FREQUENCY: 1x/week. 4 visits.   PT DURATION: 4 weeks  PLANNED INTERVENTIONS: 97110-Therapeutic exercises, 97530- Therapeutic activity, [id number]- Neuromuscular re-education, 97535- Self Care, 02859- Manual therapy, G0283- Electrical stimulation (unattended), 97016- Vasopneumatic device, [id number]- Ultrasound, 79439 (1-2 muscles), 20561 (3+ muscles)- Dry Needling, Patient/Family education, Cryotherapy, and Moist heat  PLAN FOR NEXT SESSION: RW4, full can, SDLY ER, scapular retraction, combo e'stim/US , STW/M.SABRA   Chivas Notz, [place], PT 07/28/2023, 2:38 PM

## 2023-08-03 ENCOUNTER — Ambulatory Visit: Admitting: *Deleted

## 2023-08-03 ENCOUNTER — Encounter: Payer: Self-pay | Admitting: *Deleted

## 2023-08-03 DIAGNOSIS — M25511 Pain in right shoulder: Secondary | ICD-10-CM

## 2023-08-03 NOTE — Therapy (Signed)
 OUTPATIENT PHYSICAL THERAPY SHOULDER TREATMENT   Patient Name: Joy Arroyo MRN: [id number] DOB:1955/07/08, 68 y.o., female Today's Date: 08/03/2023  END OF SESSION:  PT End of Session - 08/03/23 1056     Visit Number 2    Number of Visits 4    Date for PT Re-Evaluation 08/25/23    PT Start Time 1056    PT Stop Time 1150    PT Time Calculation (min) 54 min          Past Medical History:  Diagnosis Date   Atrophic vaginitis    Breast lump    Diverticula of colon    Family history of breast cancer    HSV (herpes simplex virus) infection    Osteopenia    Radial head fracture    S/P hernia repair    Past Surgical History:  Procedure Laterality Date   BREAST EXCISIONAL BIOPSY Left    Patient Active Problem List   Diagnosis Date Noted   Family history of breast cancer    Arthritis 05/14/2010   RMSF [facility] spotted fever) 05/14/2010   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  THERAPY DIAG:  Acute pain of right shoulder  Rationale for Evaluation and Treatment: Rehabilitation  ONSET DATE: April of 2025.  SUBJECTIVE:                                                                                                                                                                                      SUBJECTIVE STATEMENT: The patient reports that her RT shldr is doing about the same with pain  PERTINENT HISTORY: See above.  PAIN:  Are you having pain? Yes: NPRS scale: 2-3/10. Pain location: Right shoulder. Pain description: Ache, sore. Aggravating factors:  Overuse. Relieving factors: TENS unit.  PRECAUTIONS: None  RED FLAGS: None   WEIGHT BEARING RESTRICTIONS: No  FALLS:  Has patient fallen in last 6 months? No  LIVING ENVIRONMENT: Lives with: lives with their spouse Lives in: House/apartment Has following equipment at home: None   PLOF: Independent  PATIENT GOALS:Workout without pain (ie:  do planks).    NEXT MD VISIT:   OBJECTIVE:   PATIENT SURVEYS:  Quick DASH:  34  POSTURE: Generally good posture.  UPPER EXTREMITY ROM:   Full active right shoulder range  of motion with pain at endrange flexion.  UPPER EXTREMITY MMT:  Normal right shoulder and elbow strength via manual muscle testing.  SHOULDER SPECIAL TESTS: Some pain reproduction with a right shoulder Impingement test.    PALPATION:  Tender to palpation over bicipital groove, acromial ridge and middle deltoid with referred pain into right biceps.  Increased tone in posterior cuff musculature.                                                                                                                               TREATMENT DATE:  08/03/23:  Red tband exs: Rows 2x15, ext 2x15, IR 2x15, ER 2x15 HEP handout given with Red tband US  combo x 8 mins to RT shldr 1.5 w/cm2  IFC at 80-150 Hz on 40% scan x 15  minutes to patient's right shoulder   STW/M x 5 minutes.  Normal modality response following removal of modality.   PATIENT EDUCATION: Education details: Discussed HEP and short duration ice massage.   Person educated: Patient Education method: Explanation Education comprehension: verbalized understanding  HOME EXERCISE PROGRAM:   ASSESSMENT:  CLINICAL IMPRESSION: The patient arrived today doing fairly well with RT shldr. Rx focused on discussion of ADL modifications to decrease RT shldr stress as well as Red tband exs below shldr level. US  combo and STW performed to RT shldr and session ended with IFC. Pt tolerated Rx well with decreased pain end  of session. HEP handout given.     OBJECTIVE IMPAIRMENTS: decreased activity tolerance and pain.   ACTIVITY LIMITATIONS: lifting  PARTICIPATION LIMITATIONS: cleaning and yard work  [facility] POTENTIAL: Excellent  CLINICAL DECISION MAKING: Evolving/moderate complexity  EVALUATION COMPLEXITY: Moderate   GOALS:  SHORT TERM GOALS: Target date: 08/25/23:  Ind with a HEP. Goal status: INITIAL  PLAN:  PT FREQUENCY: 1x/week. 4 visits.   PT DURATION: 4 weeks  PLANNED INTERVENTIONS: 97110-Therapeutic exercises, 97530- Therapeutic activity, [id number]- Neuromuscular re-education, 97535- Self Care, 02859- Manual therapy, G0283- Electrical stimulation (unattended), 97016- Vasopneumatic device, [id number]- Ultrasound, 79439 (1-2 muscles), 20561 (3+ muscles)- Dry Needling, Patient/Family education, Cryotherapy, and Moist heat  PLAN FOR NEXT SESSION: RW4, full can, SDLY ER, scapular retraction, combo e'stim/US , STW/M.SABRA   [name],CHRIS, PTA 08/03/2023, 12:00 PM

## 2023-08-10 ENCOUNTER — Ambulatory Visit: Admitting: Physical Therapy

## 2023-08-10 DIAGNOSIS — M25511 Pain in right shoulder: Secondary | ICD-10-CM | POA: Diagnosis not present

## 2023-08-10 NOTE — Therapy (Signed)
 OUTPATIENT PHYSICAL THERAPY SHOULDER TREATMENT   Patient Name: Joy Arroyo MRN: [id number] DOB:[birth year], 68 y.o., female Today's Date: 08/10/2023  END OF SESSION:  PT End of Session - 08/10/23 1007     Visit Number 3    Number of Visits 4    Date for PT Re-Evaluation 08/25/23    PT Start Time 0930    PT Stop Time 1020    PT Time Calculation (min) 50 min    Activity Tolerance Patient tolerated treatment well    Behavior During Therapy [facility] for tasks assessed/performed          Past Medical History:  Diagnosis Date   Atrophic vaginitis    Breast lump    Diverticula of colon    Family history of breast cancer    HSV (herpes simplex virus) infection    Osteopenia    Radial head fracture    S/P hernia repair    Past Surgical History:  Procedure Laterality Date   BREAST EXCISIONAL BIOPSY Left    Patient Active Problem List   Diagnosis Date Noted   Family history of breast cancer    Arthritis 05/14/2010   [facility] spotted fever) 05/14/2010   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  THERAPY DIAG:  Acute pain of right shoulder  Rationale for Evaluation and Treatment: Rehabilitation  ONSET DATE: April of 2025.  SUBJECTIVE:                                                                                                                                                                                      SUBJECTIVE STATEMENT: Getting better.  PERTINENT HISTORY: See above.  PAIN:  Are you having pain? Yes: NPRS scale: 2-3/10. Pain location: Right  shoulder. Pain description: Ache, sore. Aggravating factors: Overuse. Relieving factors: TENS unit.  PRECAUTIONS: None  RED FLAGS: None   WEIGHT BEARING RESTRICTIONS: No  FALLS:  Has patient fallen in last 6 months? No  LIVING ENVIRONMENT: Lives with: lives with their spouse Lives in: House/apartment Has following equipment at home: None   PLOF: Independent  PATIENT GOALS:Workout without pain (ie:  do planks).    NEXT MD VISIT:   OBJECTIVE:   PATIENT SURVEYS:  Quick DASH:  34  POSTURE: Generally good posture.  UPPER EXTREMITY ROM:  Full active right shoulder range of motion with pain at endrange flexion.  UPPER EXTREMITY MMT:  Normal right shoulder and elbow strength via manual muscle testing.  SHOULDER SPECIAL TESTS: Some pain reproduction with a right shoulder Impingement test.    PALPATION:  Tender to palpation over bicipital groove, acromial ridge and middle deltoid with referred pain into right biceps.  Increased tone in posterior cuff musculature.                                                                                                                               TREATMENT DATE:   08/10/23:  Combo e'stim/US  at 1.50 w/CM2 x 12 minutes f/b STW/M x 11 minutes f/b IFC at 80-150 Hz on 40% scan x 20 minutes.      08/03/23:  Red tband exs: Rows 2x15, ext 2x15, IR 2x15, ER 2x15 HEP handout given with Red tband US  combo x 8 mins to RT shldr 1.5 w/cm2  IFC at 80-150 Hz on 40% scan x 15  minutes to patient's right shoulder   STW/M x 5 minutes.  Normal modality response following removal of modality.   PATIENT EDUCATION: Education details: Discussed HEP and short duration ice massage.   Person educated: Patient Education method: Explanation Education comprehension: verbalized understanding  HOME EXERCISE PROGRAM:   ASSESSMENT:  CLINICAL IMPRESSION: Patient pleased with progress and is compliant to her HEP.  Plan to advance HEP next visit.      OBJECTIVE IMPAIRMENTS: decreased activity tolerance and pain.   ACTIVITY LIMITATIONS: lifting  PARTICIPATION LIMITATIONS: cleaning and yard work  [facility] POTENTIAL: Excellent  CLINICAL DECISION MAKING: Evolving/moderate complexity  EVALUATION COMPLEXITY: Moderate   GOALS:  SHORT TERM GOALS: Target date: 08/25/23:  Ind with a HEP. Goal status: INITIAL  PLAN:  PT FREQUENCY: 1x/week. 4 visits.   PT DURATION: 4 weeks  PLANNED INTERVENTIONS: 97110-Therapeutic exercises, 97530- Therapeutic activity, [id number]- Neuromuscular re-education, 97535- Self Care, 02859- Manual therapy, G0283- Electrical stimulation (unattended), 97016- Vasopneumatic device, [id number]- Ultrasound, 79439 (1-2 muscles), 20561 (3+ muscles)- Dry Needling, Patient/Family education, Cryotherapy, and Moist heat  PLAN FOR NEXT SESSION: RW4, full can, SDLY ER, scapular retraction, combo e'stim/US , STW/M.SABRA   Dejah Droessler, [place], PT 08/10/2023, 10:57 AM

## 2023-08-20 ENCOUNTER — Encounter: Payer: Self-pay | Admitting: *Deleted

## 2023-08-20 ENCOUNTER — Ambulatory Visit: Attending: Orthopedic Surgery | Admitting: *Deleted

## 2023-08-20 DIAGNOSIS — M25511 Pain in right shoulder: Secondary | ICD-10-CM | POA: Insufficient documentation

## 2023-08-20 NOTE — Therapy (Addendum)
 OUTPATIENT PHYSICAL THERAPY SHOULDER TREATMENT   Patient Name: Joy Arroyo MRN: [id number] DOB:1955-12-13, 68 y.o., female Today's Date: 08/20/2023  END OF SESSION:  PT End of Session - 08/20/23 0931     Visit Number 4    Number of Visits 4    Date for PT Re-Evaluation 08/25/23    PT Start Time 0931    PT Stop Time 1020    PT Time Calculation (min) 49 min          Past Medical History:  Diagnosis Date   Atrophic vaginitis    Breast lump    Diverticula of colon    Family history of breast cancer    HSV (herpes simplex virus) infection    Osteopenia    Radial head fracture    S/P hernia repair    Past Surgical History:  Procedure Laterality Date   BREAST EXCISIONAL BIOPSY Left    Patient Active Problem List   Diagnosis Date Noted   Family history of breast cancer    Arthritis 05/14/2010   RMS[facility] spotted fever) 05/14/2010   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  THERAPY DIAG:  Acute pain of right shoulder  Rationale for Evaluation and Treatment: Rehabilitation  ONSET DATE: April of 2025.  SUBJECTIVE:                                                                                                                                                                                      SUBJECTIVE STATEMENT: RT shldr doing about the same.I'm still doing my normal activities/ ADL's.  PERTINENT HISTORY: See above.  PAIN:  Are you having pain? Yes: NPRS scale: 2-3/10. Pain location: Right shoulder. Pain description: Ache, sore. Aggravating factors:  Overuse. Relieving factors: TENS unit.  PRECAUTIONS: None  RED FLAGS: None   WEIGHT BEARING RESTRICTIONS: No  FALLS:  Has patient fallen in last 6 months? No  LIVING ENVIRONMENT: Lives with: lives with their spouse Lives in: House/apartment Has following equipment at home: None   PLOF: Independent  PATIENT GOALS:Workout without pain (ie:  do planks).    NEXT MD VISIT:   OBJECTIVE:   PATIENT SURVEYS:  Quick DASH:  34  POSTURE: Generally good posture.  UPPER EXTREMITY ROM:   Full active right shoulder range of motion  with pain at endrange flexion.  UPPER EXTREMITY MMT:  Normal right shoulder and elbow strength via manual muscle testing.  SHOULDER SPECIAL TESTS: Some pain reproduction with a right shoulder Impingement test.    PALPATION:  Tender to palpation over bicipital groove, acromial ridge and middle deltoid with referred pain into right biceps.  Increased tone in posterior cuff musculature.                                                                                                                               TREATMENT DATE:   08/20/23:  Combo e'stim/US  at 1.50 w/CM2 x 12 minutes to RT shldr  STW/M x 12 minutes to RT SHLDR  and posterior cuff  IFC at 80-150 Hz on 40% scan x 20 minutes. To RT shldr  Discussed HEP for IR and ER exs with tband.   08/03/23:  Red tband exs: Rows 2x15, ext 2x15, IR 2x15, ER 2x15 HEP handout given with Red tband US  combo x 8 mins to RT shldr 1.5 w/cm2  IFC at 80-150 Hz on 40% scan x 15  minutes to patient's right shoulder   STW/M x 5 minutes.  Normal modality response following removal of modality.   PATIENT EDUCATION: Education details: Discussed HEP and short duration ice massage.   Person educated: Patient Education method: Explanation Education comprehension: verbalized understanding  HOME EXERCISE PROGRAM:   ASSESSMENT:  CLINICAL IMPRESSION: Patient arrived today doing fairly well and ready to DC to  HEP. She feels that her shldr pain is doing about the same , but really hasn't changed her regular routine and will f/u with MD.    OBJECTIVE IMPAIRMENTS: decreased activity tolerance and pain.   ACTIVITY LIMITATIONS: lifting  PARTICIPATION LIMITATIONS: cleaning and yard work  [facility] POTENTIAL: Excellent  CLINICAL DECISION MAKING: Evolving/moderate complexity  EVALUATION COMPLEXITY: Moderate   GOALS:  SHORT TERM GOALS: Target date: 08/25/23:  Ind with a HEP. Goal status: MET  PLAN:  PT FREQUENCY: 1x/week. 4 visits.   PT DURATION: 4 weeks  PLANNED INTERVENTIONS: 97110-Therapeutic exercises, 97530- Therapeutic activity, [id number]- Neuromuscular re-education, 97535- Self Care, 02859- Manual therapy, G0283- Electrical stimulation (unattended), 97016- Vasopneumatic device, [id number]- Ultrasound, 79439 (1-2 muscles), 20561 (3+ muscles)- Dry Needling, Patient/Family education, Cryotherapy, and Moist heat  PLAN: DC to HEP   Dalylah Ramey,CHRIS, PTA 08/20/2023, 12:45 PM

## 2023-09-02 NOTE — Progress Notes (Signed)
 [facility] [address] [place] [address] Phone: [phone] Subjective:   Joy Arroyo, am serving as a scribe for Dr. Arthea Claudene.  I'm seeing this patient by the request  of:  Katina Pfeiffer, PA-C  CC: Right arm pain  YEP:Dlagzrupcz  Joy Arroyo is a 68 y.o. female coming in with complaint of R arm and shoulder pain. Therapy session helped. Injection in 07/19/23 at [facility]. Has good ROM. Laying on it can wake her at night. No home therapies. Discomfort radiates down arm to hand sometimes. Pain is achy and dull in nature. Started really around March or April.       Past Medical History:  Diagnosis Date   Atrophic vaginitis    Breast lump    Diverticula of colon    Family history of breast cancer    HSV (herpes simplex virus) infection    Osteopenia    Radial head fracture    S/P hernia repair    Past Surgical History:  Procedure Laterality Date   BREAST EXCISIONAL BIOPSY Left    Social History   Socioeconomic History   Marital status: Married    Spouse name: Not on file   Number of children: Not on file   Years of education: Not on file   Highest education level: Not on file  Occupational History   Not on file  Tobacco Use   Smoking status: Former    Current packs/day: 0.00    Types: Cigarettes    Quit date: 05/14/1978    Years since quitting: 45.3   Smokeless tobacco: Not on file  Substance and Sexual Activity   Alcohol  use: Yes   Drug use: No   Sexual activity: Not on file  Other Topics Concern   Not on file  Social History Narrative   Not on file   Social Drivers of Health   Financial Resource Strain: Not on file  Food Insecurity: Not on file  Transportation Needs: Not on file  Physical Activity: Not on file  Stress: Not on file  Social Connections: Not on file   Allergies  Allergen Reactions   Erythromycin    Percodan [Oxycodone-Aspirin]    Family History  Problem Relation Age of Onset    Alcohol  abuse Mother    Heart attack Mother    Stroke Father    Lung cancer Father    Breast cancer Sister 37       metastatic at 66   Alcohol  abuse Brother    Cirrhosis Brother    Dementia Maternal Uncle    Lung cancer Paternal Uncle    Heart disease Maternal Grandmother    Skin cancer Maternal Grandfather    Alcohol  abuse Paternal Grandfather    Breast cancer Sister 35       lobular   Dementia Paternal Uncle    Esophageal cancer Other        nephew      Current Outpatient Medications (Respiratory):    pseudoephedrine (SUDAFED) 30 MG tablet, Take 30 mg by mouth every 4 (four) hours as needed.    Current Outpatient Medications (Analgesics):    aspirin 81 MG tablet, Take 81 mg by mouth daily.     Current Outpatient Medications (Other):    ALPRAZolam (XANAX) 0.5 MG tablet, Take 0.5 mg by mouth. 1/2 to 1 tablet every 4-6 hours prn anxiety     calcium carbonate (OS-CAL) 600 MG TABS, Take 600 mg by mouth 2 (two)  times daily with a meal.     cholecalciferol (VITAMIN D) 1000 UNITS tablet, Take 1,000 Units by mouth daily.     doxycycline  (VIBRAMYCIN ) 100 MG capsule, Take 1 capsule (100 mg total) by mouth 2 (two) times daily.   estradiol (ESTRACE) 0.1 MG/GM vaginal cream, Place 2 g vaginally once a week.     eszopiclone (LUNESTA) 2 MG TABS, Take 2 mg by mouth at bedtime. Take immediately before bedtime    fish oil-omega-3 fatty acids 1000 MG capsule, Take 2 g by mouth daily.     fluocinonide (LIDEX) 0.05 % cream, Apply 1 application topically 2 (two) times daily.     hydrOXYzine (VISTARIL) 25 MG/ML injection, Inject into the muscle as needed.     meclizine  (ANTIVERT ) 25 MG tablet, Take 1 tablet (25 mg total) by mouth 3 (three) times daily as needed for dizziness.   ondansetron  (ZOFRAN -ODT) 4 MG disintegrating tablet, Take 1 tablet (4 mg total) by mouth every 8 (eight) hours as needed for nausea or vomiting.   valACYclovir (VALTREX) 500 MG tablet, Take 500 mg by mouth 2 (two) times  daily.     Reviewed prior external information including notes and imaging from  primary care provider As well as notes that were available from care everywhere and other healthcare systems.  Past medical history, social, surgical and family history all reviewed in electronic medical record.  No pertanent information unless stated regarding to the chief complaint.   Review of Systems:  No headache, visual changes, nausea, vomiting, diarrhea, constipation, dizziness, abdominal pain, skin rash, fevers, chills, night sweats, weight loss, swollen lymph nodes, body aches, joint swelling, chest pain, shortness of breath, mood changes. POSITIVE muscle aches  Objective  Blood pressure 118/86, pulse 70, height 5' 6 (1.676 m), weight 115 lb (52.2 kg), SpO2 97%.   General: No apparent distress alert and oriented x3 mood and affect normal, dressed appropriately.  HEENT: Pupils equal, extraocular movements intact  Respiratory: Patient's speak in full sentences and does not appear short of breath  Cardiovascular: No lower extremity edema, non tender, no erythema  Right arm exam shows patient does have some positive impingement noted.  Patient does have positive crossover noted.  Mild positive O'Brien's noted as well.  Limited muscular skeletal ultrasound was performed and interpreted by CLAUDENE HUSSAR, M  Limited ultrasound shows that there is some degenerative tearing noted in the pectoral tendon, across the anterior aspect of the shoulder.  Patient may have some degenerative changes of the anterior labrum but difficult to assess.  Patient's bicep tendon is unremarkable.  Significant narrowing of the acromioclavicular joint noted. Impression: Labral pathology with acromioclavicular arthritis  Procedure: Real-time Ultrasound Guided Injection of right glenohumeral joint Device: GE Logiq Q7  Ultrasound guided injection is preferred based studies that show increased duration, increased effect, greater  accuracy, decreased procedural pain, increased response rate with ultrasound guided versus blind injection.  Verbal informed consent obtained.  Time-out conducted.  Noted no overlying erythema, induration, or other signs of local infection.  Skin prepped in a sterile fashion.  Local anesthesia: Topical Ethyl chloride.  With sterile technique and under real time ultrasound guidance:  Joint visualized.  23g 1  inch needle inserted posterior approach. Pictures taken for needle placement. Patient did have injection of , 2 cc of 0.5% Marcaine, and 1.0 cc of Kenalog 40 mg/dL. Completed without difficulty  Pain immediately resolved suggesting accurate placement of the medication.  Advised to call if fevers/chills, erythema, induration, drainage, or persistent  bleeding.  Images saved Impression: Technically successful ultrasound guided injection.    Procedure: Real-time Ultrasound Guided Injection of right acromioclavicular joint Device: GE Logiq Q7 Ultrasound guided injection is preferred based studies that show increased duration, increased effect, greater accuracy, decreased procedural pain, increased response rate, and decreased cost with ultrasound guided versus blind injection.  Verbal informed consent obtained.  Time-out conducted.  Noted no overlying erythema, induration, or other signs of local infection.  Skin prepped in a sterile fashion.  Local anesthesia: Topical Ethyl chloride.  With sterile technique and under real time ultrasound guidance: With a 25-gauge half inch needle injected with 0.5 cc of 0.5% Marcaine and 0.5 cc of Kenalog 40 mg/mL Completed without difficulty  Pain immediately resolved suggesting accurate placement of the medication.  Advised to call if fevers/chills, erythema, induration, drainage, or persistent bleeding.  Impression: Technically successful ultrasound guided injection. Impression and Recommendations:    The above documentation has been reviewed and is  accurate and complete Joseff Luckman M Imad Shostak, DO

## 2023-09-07 ENCOUNTER — Other Ambulatory Visit: Payer: Self-pay

## 2023-09-07 ENCOUNTER — Ambulatory Visit (INDEPENDENT_AMBULATORY_CARE_PROVIDER_SITE_OTHER): Admitting: Family Medicine

## 2023-09-07 ENCOUNTER — Encounter: Payer: Self-pay | Admitting: Family Medicine

## 2023-09-07 VITALS — BP 118/86 | HR 70 | Ht 66.0 in | Wt 115.0 lb

## 2023-09-07 DIAGNOSIS — S43431A Superior glenoid labrum lesion of right shoulder, initial encounter: Secondary | ICD-10-CM | POA: Diagnosis not present

## 2023-09-07 DIAGNOSIS — M19011 Primary osteoarthritis, right shoulder: Secondary | ICD-10-CM | POA: Diagnosis not present

## 2023-09-07 DIAGNOSIS — M25511 Pain in right shoulder: Secondary | ICD-10-CM

## 2023-09-07 DIAGNOSIS — M19019 Primary osteoarthritis, unspecified shoulder: Secondary | ICD-10-CM | POA: Insufficient documentation

## 2023-09-07 NOTE — Patient Instructions (Signed)
 Do prescribed exercises at least 3x a week Good to see you! See you again in 2 months

## 2023-09-07 NOTE — Assessment & Plan Note (Signed)
 Injection given today and tolerated the procedure well, discussed icing regimen and home exercises, increase activity slowly.  Discussed icing regimen.  Follow-up again in 6 to 8 weeks worsening pain advanced imaging with contrast would be necessary for further evaluation of the severity of the tear.  Noticed the calcific changes noted in the posterior glenohumeral joint

## 2023-09-07 NOTE — Assessment & Plan Note (Signed)
 Injection given, tolerated the procedure well, discussed icing regimen and home exercises, discussed with patient.  Follow-up again in 6 to 8 weeks otherwise.  If worsening pain advanced imaging would be warranted with patient already failing formal physical therapy, subacromial injection, and oral anti-inflammatories.

## 2023-09-23 ENCOUNTER — Other Ambulatory Visit: Payer: Self-pay | Admitting: Nurse Practitioner

## 2023-09-23 DIAGNOSIS — Z803 Family history of malignant neoplasm of breast: Secondary | ICD-10-CM

## 2023-09-23 DIAGNOSIS — Z9189 Other specified personal risk factors, not elsewhere classified: Secondary | ICD-10-CM

## 2023-09-30 ENCOUNTER — Ambulatory Visit: Admitting: Family Medicine

## 2023-10-28 ENCOUNTER — Ambulatory Visit
Admission: RE | Admit: 2023-10-28 | Discharge: 2023-10-28 | Disposition: A | Discharge status: Home / Self Care | Source: Ambulatory Visit | Attending: Nurse Practitioner | Admitting: Nurse Practitioner

## 2023-10-28 DIAGNOSIS — Z803 Family history of malignant neoplasm of breast: Secondary | ICD-10-CM

## 2023-10-28 DIAGNOSIS — Z9189 Other specified personal risk factors, not elsewhere classified: Secondary | ICD-10-CM

## 2023-10-28 MED ORDER — GADOPICLENOL 0.5 MMOL/ML IV SOLN
5.0000 mL | Freq: Once | INTRAVENOUS | Status: AC | PRN
  Administered 2023-10-28: 5 mL via INTRAVENOUS

## 2023-11-04 NOTE — Progress Notes (Signed)
 [facility] [address] [place] [address] Phone: [phone] Subjective:   Joy Arroyo, am serving as a scribe for Dr. Arthea Claudene.  I'm seeing this patient by the request  of:  Katina Pfeiffer, PA-C  CC: Right shoulder pain  YEP:Dlagzrupcz  09/07/2023 Injection given today and tolerated the procedure well, discussed icing regimen and home exercises, increase activity slowly.  Discussed icing regimen.  Follow-up again in 6 to 8 weeks worsening pain advanced imaging with contrast would be necessary for further evaluation of the severity of the tear.  Noticed the calcific changes noted in the posterior glenohumeral joint     Injection given, tolerated the procedure well, discussed icing regimen and home exercises, discussed with patient.  Follow-up again in 6 to 8 weeks otherwise.  If worsening pain advanced imaging would be warranted with patient already failing formal physical therapy, subacromial injection, and oral anti-inflammatories.      Update 11/09/2023 Joy Arroyo is a 68 y.o. female coming in with complaint of R shoulder pain.  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  Given injections in the glenohumeral as well as the acromioclavicular joint at last exam.  Patient states that her shoulder is doing much better.        Past Medical History:  Diagnosis Date   Atrophic vaginitis    Breast lump    Diverticula of colon    Family history of breast cancer    HSV (herpes simplex virus) infection    Osteopenia    Radial head fracture    S/P hernia repair    Past Surgical History:  Procedure Laterality Date   BREAST EXCISIONAL BIOPSY Left    Social History   Socioeconomic History   Marital status: Married    Spouse name: Not on file   Number of children: Not on file   Years of education: Not on file   Highest education level: Not on file  Occupational History    Not on file  Tobacco Use   Smoking status: Former    Current packs/day: 0.00    Types: Cigarettes    Quit date: 05/14/1978    Years since quitting: 45.5   Smokeless tobacco: Not on file  Substance and Sexual Activity   Alcohol  use: Yes   Drug use: No   Sexual activity: Not on file  Other Topics Concern   Not on file  Social History Narrative   Not on file   Social Drivers of Health   Financial Resource Strain: Not on file  Food Insecurity: Not on file  Transportation Needs: Not on file  Physical Activity: Not on file  Stress: Not on file  Social Connections: Not on file   Allergies  Allergen Reactions   Erythromycin    Percodan [Oxycodone-Aspirin]    Family History  Problem Relation Age of Onset   Alcohol  abuse Mother    Heart attack Mother    Stroke Father    Lung cancer Father    Breast cancer Sister 12       metastatic at 45   Alcohol  abuse Brother    Cirrhosis Brother    Dementia Maternal Uncle    Lung cancer Paternal Uncle    Heart disease Maternal Grandmother    Skin cancer Maternal Grandfather    Alcohol  abuse Paternal Grandfather    Breast cancer Sister 18       lobular   Dementia Paternal  Uncle    Esophageal cancer Other        nephew      Current Outpatient Medications (Respiratory):    pseudoephedrine (SUDAFED) 30 MG tablet, Take 30 mg by mouth every 4 (four) hours as needed.    Current Outpatient Medications (Analgesics):    aspirin 81 MG tablet, Take 81 mg by mouth daily.     Current Outpatient Medications (Other):    ALPRAZolam (XANAX) 0.5 MG tablet, Take 0.5 mg by mouth. 1/2 to 1 tablet every 4-6 hours prn anxiety     cholecalciferol (VITAMIN D) 1000 UNITS tablet, Take 1,000 Units by mouth daily.     estradiol (ESTRACE) 0.1 MG/GM vaginal cream, Place 2 g vaginally once a week.     fluocinonide (LIDEX) 0.05 % cream, Apply 1 application topically 2 (two) times daily.     valACYclovir (VALTREX) 500 MG tablet, Take 500 mg by mouth 2 (two)  times daily.     calcium carbonate (OS-CAL) 600 MG TABS, Take 600 mg by mouth 2 (two) times daily with a meal.     doxycycline  (VIBRAMYCIN ) 100 MG capsule, Take 1 capsule (100 mg total) by mouth 2 (two) times daily.   eszopiclone (LUNESTA) 2 MG TABS, Take 2 mg by mouth at bedtime. Take immediately before bedtime    fish oil-omega-3 fatty acids 1000 MG capsule, Take 2 g by mouth daily.     hydrOXYzine (VISTARIL) 25 MG/ML injection, Inject into the muscle as needed.     meclizine  (ANTIVERT ) 25 MG tablet, Take 1 tablet (25 mg total) by mouth 3 (three) times daily as needed for dizziness.   ondansetron  (ZOFRAN -ODT) 4 MG disintegrating tablet, Take 1 tablet (4 mg total) by mouth every 8 (eight) hours as needed for nausea or vomiting.   Reviewed prior external information including notes and imaging from  primary care provider As well as notes that were available from care everywhere and other healthcare systems.  Past medical history, social, surgical and family history all reviewed in electronic medical record.  No pertanent information unless stated regarding to the chief complaint.   Review of Systems:  No headache, visual changes, nausea, vomiting, diarrhea, constipation, dizziness, abdominal pain, skin rash, fevers, chills, night sweats, weight loss, swollen lymph nodes, body aches, joint swelling, chest pain, shortness of breath, mood changes. POSITIVE muscle aches  Objective  Blood pressure 122/82, pulse 61, height 5' 6 (1.676 m).   General: No apparent distress alert and oriented x3 mood and affect normal, dressed appropriately.  HEENT: Pupils equal, extraocular movements intact  Respiratory: Patient's speak in full sentences and does not appear short of breath  Cardiovascular: No lower extremity edema, non tender, no erythema  Shoulder exam shows patient does have significant improvement in range of motion.  Still has some mild positive crossover noted.  Good strength noted.  Limited  muscular skeletal ultrasound was performed and interpreted by CLAUDENE HUSSAR, M  Limited ultrasound shows significant decrease in the hypoechoic changes noted at this time.  If he still has some arthritic changes of the acromioclavicular joint Impression interval improvement   Impression and Recommendations:    The above documentation has been reviewed and is accurate and complete Parnika Tweten M Leeland Lovelady, DO

## 2023-11-09 ENCOUNTER — Ambulatory Visit (INDEPENDENT_AMBULATORY_CARE_PROVIDER_SITE_OTHER): Admitting: Family Medicine

## 2023-11-09 ENCOUNTER — Encounter: Payer: Self-pay | Admitting: Family Medicine

## 2023-11-09 ENCOUNTER — Other Ambulatory Visit: Payer: Self-pay

## 2023-11-09 VITALS — BP 122/82 | HR 61 | Ht 66.0 in

## 2023-11-09 DIAGNOSIS — M25511 Pain in right shoulder: Secondary | ICD-10-CM

## 2023-11-09 DIAGNOSIS — M19011 Primary osteoarthritis, right shoulder: Secondary | ICD-10-CM

## 2023-11-09 NOTE — Assessment & Plan Note (Addendum)
 No need for further interventions at this moment.  Patient is 80 to 90% better at this point.  No other significant changes noted as well.  Discussed keeping hands within the peripheral vision.  Increase activity slowly.  Discussed icing regimen.  Follow-up again in 12 weeks if any trouble.

## 2024-02-09 ENCOUNTER — Ambulatory Visit: Admitting: Family Medicine

## 2024-02-15 ENCOUNTER — Other Ambulatory Visit: Payer: Self-pay | Admitting: Nurse Practitioner

## 2024-02-15 DIAGNOSIS — Z1231 Encounter for screening mammogram for malignant neoplasm of breast: Secondary | ICD-10-CM

## 2024-03-09 ENCOUNTER — Ambulatory Visit: Admitting: Family Medicine

## 2024-03-14 ENCOUNTER — Ambulatory Visit
